# Patient Record
Sex: Male | Born: 1965 | Race: Black or African American | Hispanic: No | Marital: Single | Smoking: Current every day smoker
Health system: Southern US, Community
[De-identification: ages and names within clinical notes are randomized; demographics above are authoritative.]

## PROBLEM LIST (undated history)

## (undated) DIAGNOSIS — F32A Depression, unspecified: Secondary | ICD-10-CM

## (undated) DIAGNOSIS — K219 Gastro-esophageal reflux disease without esophagitis: Secondary | ICD-10-CM

## (undated) DIAGNOSIS — F329 Major depressive disorder, single episode, unspecified: Secondary | ICD-10-CM

## (undated) DIAGNOSIS — F319 Bipolar disorder, unspecified: Secondary | ICD-10-CM

## (undated) DIAGNOSIS — I10 Essential (primary) hypertension: Secondary | ICD-10-CM

## (undated) DIAGNOSIS — F200 Paranoid schizophrenia: Secondary | ICD-10-CM

## (undated) HISTORY — PX: FINGER SURGERY: SHX640

## (undated) HISTORY — DX: Depression, unspecified: F32.A

## (undated) HISTORY — DX: Essential (primary) hypertension: I10

## (undated) HISTORY — DX: Major depressive disorder, single episode, unspecified: F32.9

## (undated) HISTORY — DX: Gastro-esophageal reflux disease without esophagitis: K21.9

## (undated) HISTORY — PX: KNEE SURGERY: SHX244

---

## 2011-08-03 DIAGNOSIS — F209 Schizophrenia, unspecified: Secondary | ICD-10-CM | POA: Diagnosis not present

## 2011-08-31 DIAGNOSIS — F209 Schizophrenia, unspecified: Secondary | ICD-10-CM | POA: Diagnosis not present

## 2011-12-22 DIAGNOSIS — F209 Schizophrenia, unspecified: Secondary | ICD-10-CM | POA: Diagnosis not present

## 2012-01-19 DIAGNOSIS — F209 Schizophrenia, unspecified: Secondary | ICD-10-CM | POA: Diagnosis not present

## 2012-02-17 DIAGNOSIS — F209 Schizophrenia, unspecified: Secondary | ICD-10-CM | POA: Diagnosis not present

## 2012-06-12 DIAGNOSIS — F209 Schizophrenia, unspecified: Secondary | ICD-10-CM | POA: Diagnosis not present

## 2012-07-10 DIAGNOSIS — F209 Schizophrenia, unspecified: Secondary | ICD-10-CM | POA: Diagnosis not present

## 2013-03-26 DIAGNOSIS — F2089 Other schizophrenia: Secondary | ICD-10-CM | POA: Diagnosis not present

## 2013-03-27 DIAGNOSIS — F2089 Other schizophrenia: Secondary | ICD-10-CM | POA: Diagnosis not present

## 2013-04-11 DIAGNOSIS — F29 Unspecified psychosis not due to a substance or known physiological condition: Secondary | ICD-10-CM | POA: Diagnosis not present

## 2013-04-11 DIAGNOSIS — F2089 Other schizophrenia: Secondary | ICD-10-CM | POA: Diagnosis not present

## 2013-04-20 DIAGNOSIS — F29 Unspecified psychosis not due to a substance or known physiological condition: Secondary | ICD-10-CM | POA: Diagnosis not present

## 2013-04-20 DIAGNOSIS — F2089 Other schizophrenia: Secondary | ICD-10-CM | POA: Diagnosis not present

## 2013-05-09 DIAGNOSIS — F2089 Other schizophrenia: Secondary | ICD-10-CM | POA: Diagnosis not present

## 2013-05-09 DIAGNOSIS — F29 Unspecified psychosis not due to a substance or known physiological condition: Secondary | ICD-10-CM | POA: Diagnosis not present

## 2013-06-26 DIAGNOSIS — F29 Unspecified psychosis not due to a substance or known physiological condition: Secondary | ICD-10-CM | POA: Diagnosis not present

## 2013-06-26 DIAGNOSIS — F2089 Other schizophrenia: Secondary | ICD-10-CM | POA: Diagnosis not present

## 2013-07-24 DIAGNOSIS — F29 Unspecified psychosis not due to a substance or known physiological condition: Secondary | ICD-10-CM | POA: Diagnosis not present

## 2013-07-24 DIAGNOSIS — F2089 Other schizophrenia: Secondary | ICD-10-CM | POA: Diagnosis not present

## 2013-08-08 DIAGNOSIS — F29 Unspecified psychosis not due to a substance or known physiological condition: Secondary | ICD-10-CM | POA: Diagnosis not present

## 2013-08-21 DIAGNOSIS — F29 Unspecified psychosis not due to a substance or known physiological condition: Secondary | ICD-10-CM | POA: Diagnosis not present

## 2013-09-25 DIAGNOSIS — F29 Unspecified psychosis not due to a substance or known physiological condition: Secondary | ICD-10-CM | POA: Diagnosis not present

## 2013-09-26 ENCOUNTER — Other Ambulatory Visit: Payer: Self-pay | Admitting: Internal Medicine

## 2013-09-26 ENCOUNTER — Ambulatory Visit
Admission: RE | Admit: 2013-09-26 | Discharge: 2013-09-26 | Disposition: A | Discharge status: Home / Self Care | Payer: Medicare Other | Source: Ambulatory Visit | Attending: Internal Medicine | Admitting: Internal Medicine

## 2013-09-26 DIAGNOSIS — M5430 Sciatica, unspecified side: Secondary | ICD-10-CM

## 2013-09-26 DIAGNOSIS — Q5569 Other congenital malformation of penis: Secondary | ICD-10-CM | POA: Diagnosis not present

## 2013-09-26 DIAGNOSIS — I10 Essential (primary) hypertension: Secondary | ICD-10-CM | POA: Diagnosis not present

## 2013-09-26 DIAGNOSIS — M47817 Spondylosis without myelopathy or radiculopathy, lumbosacral region: Secondary | ICD-10-CM | POA: Diagnosis not present

## 2013-10-02 DIAGNOSIS — N4889 Other specified disorders of penis: Secondary | ICD-10-CM | POA: Diagnosis not present

## 2013-10-30 DIAGNOSIS — F29 Unspecified psychosis not due to a substance or known physiological condition: Secondary | ICD-10-CM | POA: Diagnosis not present

## 2013-12-04 ENCOUNTER — Emergency Department (HOSPITAL_COMMUNITY)
Admission: EM | Admit: 2013-12-04 | Discharge: 2013-12-04 | Disposition: A | Discharge status: Home / Self Care | Payer: Medicare Other | Attending: Emergency Medicine | Admitting: Emergency Medicine

## 2013-12-04 ENCOUNTER — Encounter (HOSPITAL_COMMUNITY): Payer: Self-pay | Admitting: Emergency Medicine

## 2013-12-04 DIAGNOSIS — R21 Rash and other nonspecific skin eruption: Secondary | ICD-10-CM | POA: Insufficient documentation

## 2013-12-04 DIAGNOSIS — R300 Dysuria: Secondary | ICD-10-CM | POA: Insufficient documentation

## 2013-12-04 MED ORDER — LOSARTAN POTASSIUM-HCTZ 50-12.5 MG PO TABS: 1.0000 | ORAL_TABLET | Freq: Every day | ORAL | Status: DC

## 2013-12-04 MED ORDER — CLOTRIMAZOLE 1 % EX CREA: TOPICAL_CREAM | CUTANEOUS | Status: DC

## 2013-12-04 NOTE — ED Provider Notes (Signed)
CSN: [id number]     Arrival date & time 12/04/13  0955 History  This chart was scribed for non-physician practitioner, Emilia BeckKaitlyn Rex Magee, PA-C working with Raeford RazorStephen Kohut, MD by Greggory StallionKayla Andersen, ED scribe. This patient was seen in room TR07C/TR07C and the patient's care was started at 10:10 AM.    Chief Complaint  Patient presents with  . Rash   The history is provided by the patient. No language interpreter was used.   HPI Comments: Charles Cantorhomas Liscano is a 48 y.o. male who presents to the Emergency Department complaining of worsening itchy pustules on his penis that started 4 months ago. Pt states it intermittently hurts and is sometimes scabbed over. He has used alcohol with relief of pain. States he recently had unprotected sex with an old partner. [facility] and was given a cream with no relief and urology referral. He has intermittent dysuria. Denies penile discharge, testicular pain, scrotal swelling.   History reviewed. No pertinent past medical history. History reviewed. No pertinent past surgical history. No family history on file. History  Substance Use Topics  . Smoking status: Not on file  . Smokeless tobacco: Not on file  . Alcohol Use: Not on file    Review of Systems  Genitourinary: Positive for dysuria. Negative for discharge, scrotal swelling and testicular pain.  Skin: Positive for rash.  All other systems reviewed and are negative.  Allergies  Review of patient's allergies indicates not on file.  Home Medications   Prior to Admission medications   Not on File   BP 124/74  Pulse 100  Temp(Src) 97.4 F (36.3 C) (Oral)  Resp 16  Ht 5\' 10"  (1.778 m)  Wt 200 lb (90.719 kg)  BMI 28.70 kg/m2  SpO2 100%  Physical Exam  Nursing note and vitals reviewed. Constitutional: He is oriented to person, place, and time. He appears well-developed and well-nourished. No distress.  HENT:  Head: Normocephalic and atraumatic.  Eyes: EOM are normal.   Neck: Neck supple. No tracheal deviation present.  Cardiovascular: Normal rate.   Pulmonary/Chest: Effort normal. No respiratory distress.  Genitourinary: Circumcised.  Grey-ish scabbed lesions around the glands of the penis that are non tender to palpation. No drainage. No open wounds. Scrotum unremarkable.   Musculoskeletal: Normal range of motion.  Neurological: He is alert and oriented to person, place, and time.  Skin: Skin is warm and dry.  Psychiatric: He has a normal mood and affect. His behavior is normal.    ED Course  Procedures (including critical care time)  DIAGNOSTIC STUDIES: Oxygen Saturation is 100% on RA, normal by my interpretation.    COORDINATION OF CARE: 10:14 AM-Discussed treatment plan which includes speaking with Dr. Juleen ChinaKohut with pt at bedside and pt agreed to plan.   Labs Review Labs Reviewed - No data to display  Imaging Review No results found.   EKG Interpretation None      MDM   Final diagnoses:  Penile rash    Patient will be discharged with lotrimin for possible fungal infection. He is advised to follow up with urology. Patient advised to wash with mild soap and dry the area. Vitals stable and patient afebrile.   I personally performed the services described in this documentation, which was scribed in my presence. The recorded information has been reviewed and is accurate.  Emilia BeckKaitlyn Tellis Spivak, [place]PA-C 12/04/13 1207

## 2013-12-04 NOTE — ED Notes (Signed)
PT reports the rash and itching to penis started 3 months ago. Pt was seen and treated by a PCP . There has been no change in rash. Pt reports SX's worse.

## 2013-12-04 NOTE — Discharge Instructions (Signed)
Apply the prescribed cream to the affected area. Discontinue use of alcohol to the area. Use mild soap to wash the area and dry well.

## 2013-12-04 NOTE — Discharge Planning (Signed)
[facility]  Patient expressed to PA IyanbitoKaitlyn S. that he was in need of a primary care provider. Spoke to pt stated he was discharged from his PCP for unknown reasons. Patient was given a list of providers in the area who accept his insurance. My contact information was provided for any future questions or concerns.

## 2013-12-08 NOTE — ED Provider Notes (Signed)
Medical screening examination/treatment/procedure(s) were performed by non-physician practitioner and as supervising physician I was immediately available for consultation/collaboration.   EKG Interpretation None       Anandi Abramo, MD 12/08/13 1429 

## 2014-03-28 ENCOUNTER — Encounter (HOSPITAL_COMMUNITY): Payer: Self-pay | Admitting: Emergency Medicine

## 2014-03-28 ENCOUNTER — Emergency Department (HOSPITAL_COMMUNITY)
Admission: EM | Admit: 2014-03-28 | Discharge: 2014-03-28 | Discharge status: Against Medical Advice | Payer: Medicare Other | Attending: Emergency Medicine | Admitting: Emergency Medicine

## 2014-03-28 DIAGNOSIS — F430 Acute stress reaction: Secondary | ICD-10-CM | POA: Insufficient documentation

## 2014-03-28 DIAGNOSIS — F17200 Nicotine dependence, unspecified, uncomplicated: Secondary | ICD-10-CM | POA: Insufficient documentation

## 2014-03-28 DIAGNOSIS — I10 Essential (primary) hypertension: Secondary | ICD-10-CM | POA: Diagnosis not present

## 2014-03-28 DIAGNOSIS — R52 Pain, unspecified: Secondary | ICD-10-CM | POA: Diagnosis not present

## 2014-03-28 DIAGNOSIS — Z5982 Transportation insecurity: Secondary | ICD-10-CM

## 2014-03-28 DIAGNOSIS — M25579 Pain in unspecified ankle and joints of unspecified foot: Secondary | ICD-10-CM | POA: Diagnosis not present

## 2014-03-28 DIAGNOSIS — Z79899 Other long term (current) drug therapy: Secondary | ICD-10-CM | POA: Insufficient documentation

## 2014-03-28 DIAGNOSIS — Z598 Other problems related to housing and economic circumstances: Secondary | ICD-10-CM

## 2014-03-28 NOTE — ED Notes (Signed)
Pt walking around ED sts he is banned from bus and wanted a police officer to take him home.  He then asked for numbers to cabs and then came back stating he wanted to check in

## 2014-03-28 NOTE — ED Notes (Signed)
Pt. Is in the room screaming and talking to himself.  Pt. Also was laying in the bed, moving all over the bed.  Dr. Loretha Stapler went to talk with pt. Pt. Is yelling at Dr. Loretha Stapler.

## 2014-03-28 NOTE — ED Notes (Signed)
Pt in c/ o generalized body aches, onset x 1-2 mths, pt c/o chronic L lower back pain , pt non cooperative, pt c/o bil feet pain, pt has large amt callouses on heels, pt A&O x4

## 2014-03-28 NOTE — ED Notes (Signed)
Pt.came out and stated , "I have to get home.  I live on [address] and I am not  Able to ride the bus."  "I will be arrested."  "I want to talk to the police."  Pt. Taken to speak to the police.  Informed Dr. Loretha Stapler  That pt. Wants to leave.

## 2014-03-28 NOTE — ED Provider Notes (Signed)
CSN: [id number]     Arrival date & time 03/28/14  0820 History   First MD Initiated Contact with Patient 03/28/14 [phone]     Chief Complaint  Patient presents with  . Generalized Body Aches     (Consider location/radiation/quality/duration/timing/severity/associated sxs/prior Treatment) HPI Comments: 48 yo male presenting to the Emergency Department with the chief complaint of "I need a ride home."  He states he was [place] from the bus depot and therefore came here looking for a ride to get home.  During his short ED stay, he made several complaints to the nursing staff, such as body aches and bilateral foot pain.  He was not willing to discuss these complaints with me.  He would only state that he needed a ride home.    He reported that he takes medicine for high blood pressure but denied any other medications or medical problems.  History was otherwise limited to the above.    History reviewed. No pertinent past medical history. Past Surgical History  Procedure Laterality Date  . Knee surgery    . Finger surgery     No family history on file. History  Substance Use Topics  . Smoking status: Current Every Day Smoker -- 0.50 packs/day  . Smokeless tobacco: Not on file  . Alcohol Use: No    Review of Systems  Unable to perform ROS Pt uncooperative with ROS    Allergies  Pork-derived products  Home Medications   Prior to Admission medications   Medication Sig Start Date End Date Taking? Authorizing Provider  losartan-hydrochlorothiazide (HYZAAR) 50-12.5 MG per tablet Take 1 tablet by mouth daily. 12/04/13  Yes Kaitlyn Szekalski, PA-C   BP 145/74  Pulse 114  Temp(Src) 97.8 F (36.6 C) (Oral)  Resp 16  SpO2 100% Physical Exam  Nursing note and vitals reviewed. Constitutional: He is oriented to person, place, and time. He appears well-developed and well-nourished. He is uncooperative. No distress.  disheveled  HENT:  Head: Normocephalic and atraumatic.  Eyes:  Conjunctivae are normal. No scleral icterus.  Neck: Neck supple.  Cardiovascular: Normal rate and intact distal pulses.   Pulmonary/Chest: Effort normal. No stridor. No respiratory distress.  Abdominal: Normal appearance. He exhibits no distension.  Neurological: He is alert and oriented to person, place, and time.  Skin: Skin is warm and dry. No rash noted.  Psychiatric: His speech is normal. He is agitated.    ED Course  Procedures (including critical care time) Labs Review Labs Reviewed - No data to display  Imaging Review No results found.   EKG Interpretation None      MDM   Final diagnoses:  Inability to acquire transportation    48 yo male presenting with the chief complaint of needed a ride home.  He was agitated, but alert and oriented.  He denied specific complaints to me.  Before I was able to obtain any additional history, he decided that he did not want to stay and that the only reason that he came to the ED was to have [place] call the police so he could get a ride home from them.      Candyce Churn III, MD 03/28/14 1021

## 2014-03-29 ENCOUNTER — Emergency Department (HOSPITAL_COMMUNITY)
Admission: EM | Admit: 2014-03-29 | Discharge: 2014-03-31 | Disposition: A | Discharge status: Home / Self Care | Payer: Medicare Other | Attending: Emergency Medicine | Admitting: Emergency Medicine

## 2014-03-29 ENCOUNTER — Encounter (HOSPITAL_COMMUNITY): Payer: Self-pay | Admitting: Emergency Medicine

## 2014-03-29 DIAGNOSIS — F22 Delusional disorders: Secondary | ICD-10-CM | POA: Diagnosis not present

## 2014-03-29 DIAGNOSIS — F209 Schizophrenia, unspecified: Secondary | ICD-10-CM | POA: Diagnosis present

## 2014-03-29 DIAGNOSIS — F911 Conduct disorder, childhood-onset type: Secondary | ICD-10-CM | POA: Insufficient documentation

## 2014-03-29 DIAGNOSIS — F29 Unspecified psychosis not due to a substance or known physiological condition: Secondary | ICD-10-CM | POA: Insufficient documentation

## 2014-03-29 DIAGNOSIS — Z91199 Patient's noncompliance with other medical treatment and regimen due to unspecified reason: Secondary | ICD-10-CM | POA: Insufficient documentation

## 2014-03-29 DIAGNOSIS — Z9119 Patient's noncompliance with other medical treatment and regimen: Secondary | ICD-10-CM | POA: Diagnosis not present

## 2014-03-29 DIAGNOSIS — Z008 Encounter for other general examination: Secondary | ICD-10-CM | POA: Diagnosis not present

## 2014-03-29 DIAGNOSIS — Z79899 Other long term (current) drug therapy: Secondary | ICD-10-CM | POA: Diagnosis not present

## 2014-03-29 DIAGNOSIS — R45850 Homicidal ideations: Secondary | ICD-10-CM | POA: Insufficient documentation

## 2014-03-29 DIAGNOSIS — F411 Generalized anxiety disorder: Secondary | ICD-10-CM | POA: Insufficient documentation

## 2014-03-29 DIAGNOSIS — F909 Attention-deficit hyperactivity disorder, unspecified type: Secondary | ICD-10-CM | POA: Insufficient documentation

## 2014-03-29 DIAGNOSIS — F200 Paranoid schizophrenia: Secondary | ICD-10-CM | POA: Diagnosis not present

## 2014-03-29 DIAGNOSIS — F23 Brief psychotic disorder: Secondary | ICD-10-CM

## 2014-03-29 DIAGNOSIS — IMO0002 Reserved for concepts with insufficient information to code with codable children: Secondary | ICD-10-CM | POA: Diagnosis not present

## 2014-03-29 HISTORY — DX: Paranoid schizophrenia: F20.0

## 2014-03-29 HISTORY — DX: Bipolar disorder, unspecified: F31.9

## 2014-03-29 LAB — CBC WITH DIFFERENTIAL/PLATELET
Basophils Absolute: 0 10*3/uL (ref 0.0–0.1)
Basophils Relative: 0 % (ref 0–1)
EOS ABS: 0.1 10*3/uL (ref 0.0–0.7)
EOS PCT: 1 % (ref 0–5)
HCT: 36.5 % — ABNORMAL LOW (ref 39.0–52.0)
Hemoglobin: 12.7 g/dL — ABNORMAL LOW (ref 13.0–17.0)
Lymphocytes Relative: 23 % (ref 12–46)
Lymphs Abs: 1.9 10*3/uL (ref 0.7–4.0)
MCH: 28 pg (ref 26.0–34.0)
MCHC: 34.8 g/dL (ref 30.0–36.0)
MCV: 80.4 fL (ref 78.0–100.0)
MONOS PCT: 10 % (ref 3–12)
Monocytes Absolute: 0.8 10*3/uL (ref 0.1–1.0)
NEUTROS PCT: 66 % (ref 43–77)
Neutro Abs: 5.4 10*3/uL (ref 1.7–7.7)
PLATELETS: 257 10*3/uL (ref 150–400)
RBC: 4.54 MIL/uL (ref 4.22–5.81)
RDW: 12.7 % (ref 11.5–15.5)
WBC: 8.2 10*3/uL (ref 4.0–10.5)

## 2014-03-29 LAB — COMPREHENSIVE METABOLIC PANEL
ALBUMIN: 3.9 g/dL (ref 3.5–5.2)
ALK PHOS: 77 U/L (ref 39–117)
ALT: 39 U/L (ref 0–53)
ANION GAP: 12 (ref 5–15)
AST: 39 U/L — ABNORMAL HIGH (ref 0–37)
BUN: 11 mg/dL (ref 6–23)
CALCIUM: 9.9 mg/dL (ref 8.4–10.5)
CO2: 25 mEq/L (ref 19–32)
Chloride: 95 mEq/L — ABNORMAL LOW (ref 96–112)
Creatinine, Ser: 0.91 mg/dL (ref 0.50–1.35)
GFR calc non Af Amer: >90 mL/min (ref >90)
GLUCOSE: 131 mg/dL — AB (ref 70–99)
Potassium: 3.9 mEq/L (ref 3.7–5.3)
Sodium: 132 mEq/L — ABNORMAL LOW (ref 137–147)
TOTAL PROTEIN: 7.2 g/dL (ref 6.0–8.3)
Total Bilirubin: 0.4 mg/dL (ref 0.3–1.2)

## 2014-03-29 LAB — RAPID URINE DRUG SCREEN, HOSP PERFORMED
Amphetamines: NOT DETECTED
BARBITURATES: NOT DETECTED
Benzodiazepines: NOT DETECTED
Cocaine: NOT DETECTED
OPIATES: NOT DETECTED
Tetrahydrocannabinol: NOT DETECTED

## 2014-03-29 LAB — SALICYLATE LEVEL: Salicylate Lvl: <2 mg/dL — ABNORMAL LOW (ref 2.8–20.0)

## 2014-03-29 LAB — ACETAMINOPHEN LEVEL: Acetaminophen (Tylenol), Serum: <15 ug/mL (ref 10–30)

## 2014-03-29 LAB — ETHANOL

## 2014-03-29 MED ORDER — RISPERIDONE 2 MG PO TABS
2.0000 mg | ORAL_TABLET | Freq: Two times a day (BID) | ORAL | Status: DC
  Administered 2014-03-29 – 2014-03-31 (×5): 2 mg via ORAL
  Filled 2014-03-29 (×6): qty 1

## 2014-03-29 MED ORDER — DIPHENHYDRAMINE HCL 50 MG/ML IJ SOLN: 50.0000 mg | Freq: Once | INTRAMUSCULAR | Status: AC

## 2014-03-29 MED ORDER — LORAZEPAM 2 MG/ML IJ SOLN
2.0000 mg | Freq: Once | INTRAMUSCULAR | Status: AC
  Administered 2014-03-29: 2 mg via INTRAMUSCULAR

## 2014-03-29 MED ORDER — ZIPRASIDONE MESYLATE 20 MG IM SOLR
20.0000 mg | Freq: Once | INTRAMUSCULAR | Status: AC
  Administered 2014-03-29: 20 mg via INTRAMUSCULAR

## 2014-03-29 MED ORDER — BENZTROPINE MESYLATE 1 MG PO TABS
1.0000 mg | ORAL_TABLET | Freq: Two times a day (BID) | ORAL | Status: DC
  Administered 2014-03-29 – 2014-03-31 (×5): 1 mg via ORAL
  Filled 2014-03-29 (×6): qty 1

## 2014-03-29 MED ORDER — ZIPRASIDONE MESYLATE 20 MG IM SOLR: 20.0000 mg | Freq: Once | INTRAMUSCULAR | Status: AC

## 2014-03-29 MED ORDER — ZIPRASIDONE MESYLATE 20 MG IM SOLR
INTRAMUSCULAR | Status: AC
  Filled 2014-03-29: qty 20

## 2014-03-29 MED ORDER — LORAZEPAM 2 MG/ML IJ SOLN
INTRAMUSCULAR | Status: AC
  Administered 2014-03-29: 2 mg via INTRAMUSCULAR
  Filled 2014-03-29: qty 1

## 2014-03-29 MED ORDER — TRAZODONE HCL 50 MG PO TABS: 50.0000 mg | ORAL_TABLET | Freq: Every evening | ORAL | Status: DC | PRN

## 2014-03-29 MED ORDER — STERILE WATER FOR INJECTION IJ SOLN
INTRAMUSCULAR | Status: AC
  Filled 2014-03-29: qty 10

## 2014-03-29 MED ORDER — DIPHENHYDRAMINE HCL 50 MG/ML IJ SOLN
INTRAMUSCULAR | Status: AC
  Filled 2014-03-29: qty 1

## 2014-03-29 MED ORDER — LORAZEPAM 2 MG/ML IJ SOLN: 2.0000 mg | Freq: Three times a day (TID) | INTRAMUSCULAR | Status: DC | PRN

## 2014-03-29 MED ORDER — ZIPRASIDONE MESYLATE 20 MG IM SOLR: 10.0000 mg | Freq: Three times a day (TID) | INTRAMUSCULAR | Status: DC | PRN

## 2014-03-29 MED ORDER — DIPHENHYDRAMINE HCL 50 MG/ML IJ SOLN: 50.0000 mg | Freq: Three times a day (TID) | INTRAMUSCULAR | Status: DC | PRN

## 2014-03-29 MED ADMIN — Diphenhydramine HCl Inj 50 MG/ML: INTRAMUSCULAR | @ 09:00:00

## 2014-03-29 NOTE — ED Notes (Signed)
Per patient's sister she notice a change in his behavior when she came to visit in June. The patient was very easily agitated and aggressive towards family for no reason. Sister has been coming back and forth from [place] to check on dying mother and the patient's behavior and appearance. According to sister patient has history of bipolar disorder, paranoid schizophrenia and crack cocaine addiction. He was brought in tonight by sister and father for help.

## 2014-03-29 NOTE — ED Notes (Signed)
Family has patient's belongings. (Sister)

## 2014-03-29 NOTE — Consult Note (Signed)
[facility] Face-to-Face Psychiatry Consult   Reason for Consult:  Paranoia and aggressive behavior Referring Physician:  EDP Charles Cortez is an 48 y.o. male. Total Time spent with patient: 30 minutes  Assessment: AXIS I:  Chronic Paranoid Schizophrenia AXIS II:  Cluster B Traits AXIS III:   Past Medical History  Diagnosis Date  . Paranoid schizophrenia   . Bipolar affective disorder    AXIS IV:  other psychosocial or environmental problems and problems with access to health care services AXIS V:  21-30 behavior considerably influenced by delusions or hallucinations OR serious impairment in judgment, communication OR inability to function in almost all areas  Plan:  Recommend psychiatric Inpatient admission when medically cleared.  Subjective:   Charles Cortez is a 48 y.o. male patient admitted with aggressive behavior and paranoia.  HPI: Patient with history of paranoid schizophrenia and bipolar affective disorder presents to the emergency department for behavioral evaluation.Patient is uncooperative and belligerent most of the history is obtained from the chart and the nurses. Per record, patient's sister states that she noticed a change in his behavior when she came to visit in June. She states that patient has been very easily agitated and aggressive, threatening others for no reason. She states that this behavior got him kicked off the public transit system. She  endorses a history of crack cocaine addiction, but states the patient has been clean for 7 years. She believes he has been noncompliant with his psychiatric regimen. Patient visibly paranoid and easily agitated on arrival. He is fixated on the police being against him, he states that they have no business barring him from using the public transportation. Patient got really agitated during the interview and had to receive  $Remove'20mg'ysUuVEb$  IM Geodon, $RemoveBe'50mg'LDHcIvhol$  of Benadryl and Ativan when verbal de-escalation failed.  HPI Elements:   Location:  mood  lability, psychosis, delusions. Severity:  reccurent episode. Duration:  2 months. Context:  poor compliance with medications.  Past Psychiatric History: Past Medical History  Diagnosis Date  . Paranoid schizophrenia   . Bipolar affective disorder     reports that he has been smoking.  He does not have any smokeless tobacco history on file. He reports that he does not drink alcohol or use illicit drugs. No family history on file.         Allergies:   Allergies  Allergen Reactions  . Pork-Derived Products Swelling    ACT Assessment Complete:  Yes:    Educational Status    Risk to Self: Risk to self with the past 6 months Is patient at risk for suicide?: Yes Substance abuse history and/or treatment for substance abuse?: Yes  Risk to Others:    Abuse:    Prior Inpatient Therapy:    Prior Outpatient Therapy:    Additional Information:           Objective: Blood pressure 112/97, pulse 82, temperature 98.1 F (36.7 C), temperature source Oral, resp. rate 16, SpO2 99.00%.There is no weight on file to calculate BMI. Results for orders placed during the hospital encounter of 03/29/14 (from the past 72 hour(s))  CBC WITH DIFFERENTIAL     Status: Abnormal   Collection Time    03/29/14  1:06 AM      Result Value Ref Range   WBC 8.2  4.0 - 10.5 K/uL   RBC 4.54  4.22 - 5.81 MIL/uL   Hemoglobin 12.7 (*) 13.0 - 17.0 g/dL   HCT 36.5 (*) 39.0 - 52.0 %   MCV  80.4  78.0 - 100.0 fL   MCH 28.0  26.0 - 34.0 pg   MCHC 34.8  30.0 - 36.0 g/dL   RDW 12.7  11.5 - 15.5 %   Platelets 257  150 - 400 K/uL   Neutrophils Relative % 66  43 - 77 %   Neutro Abs 5.4  1.7 - 7.7 K/uL   Lymphocytes Relative 23  12 - 46 %   Lymphs Abs 1.9  0.7 - 4.0 K/uL   Monocytes Relative 10  3 - 12 %   Monocytes Absolute 0.8  0.1 - 1.0 K/uL   Eosinophils Relative 1  0 - 5 %   Eosinophils Absolute 0.1  0.0 - 0.7 K/uL   Basophils Relative 0  0 - 1 %   Basophils Absolute 0.0  0.0 - 0.1 K/uL  COMPREHENSIVE  METABOLIC PANEL     Status: Abnormal   Collection Time    03/29/14  1:06 AM      Result Value Ref Range   Sodium 132 (*) 137 - 147 mEq/L   Potassium 3.9  3.7 - 5.3 mEq/L   Chloride 95 (*) 96 - 112 mEq/L   CO2 25  19 - 32 mEq/L   Glucose, Bld 131 (*) 70 - 99 mg/dL   BUN 11  6 - 23 mg/dL   Creatinine, Ser 0.91  0.50 - 1.35 mg/dL   Calcium 9.9  8.4 - 10.5 mg/dL   Total Protein 7.2  6.0 - 8.3 g/dL   Albumin 3.9  3.5 - 5.2 g/dL   AST 39 (*) 0 - 37 U/L   ALT 39  0 - 53 U/L   Alkaline Phosphatase 77  39 - 117 U/L   Total Bilirubin 0.4  0.3 - 1.2 mg/dL   GFR calc non Af Amer >90  >90 mL/min   GFR calc Af Amer >90  >90 mL/min   Comment: (NOTE)     The eGFR has been calculated using the CKD EPI equation.     This calculation has not been validated in all clinical situations.     eGFR's persistently <90 mL/min signify possible Chronic Kidney     Disease.   Anion gap 12  5 - 15  ETHANOL     Status: None   Collection Time    03/29/14  1:06 AM      Result Value Ref Range   Alcohol, Ethyl (B) <11  0 - 11 mg/dL   Comment:            LOWEST DETECTABLE LIMIT FOR     SERUM ALCOHOL IS 11 mg/dL     FOR MEDICAL PURPOSES ONLY  ACETAMINOPHEN LEVEL     Status: None   Collection Time    03/29/14  1:06 AM      Result Value Ref Range   Acetaminophen (Tylenol), Serum <15.0  10 - 30 ug/mL   Comment:            THERAPEUTIC CONCENTRATIONS VARY     SIGNIFICANTLY. A RANGE OF 10-30     ug/mL MAY BE AN EFFECTIVE     CONCENTRATION FOR MANY PATIENTS.     HOWEVER, SOME ARE BEST TREATED     AT CONCENTRATIONS OUTSIDE THIS     RANGE.     ACETAMINOPHEN CONCENTRATIONS     >150 ug/mL AT 4 HOURS AFTER     INGESTION AND >50 ug/mL AT 12     HOURS AFTER INGESTION ARE  OFTEN ASSOCIATED WITH TOXIC     REACTIONS.  SALICYLATE LEVEL     Status: Abnormal   Collection Time    03/29/14  1:06 AM      Result Value Ref Range   Salicylate Lvl <7.0 (*) 2.8 - 20.0 mg/dL  URINE RAPID DRUG SCREEN (HOSP PERFORMED)      Status: None   Collection Time    03/29/14  1:16 AM      Result Value Ref Range   Opiates NONE DETECTED  NONE DETECTED   Cocaine NONE DETECTED  NONE DETECTED   Benzodiazepines NONE DETECTED  NONE DETECTED   Amphetamines NONE DETECTED  NONE DETECTED   Tetrahydrocannabinol NONE DETECTED  NONE DETECTED   Barbiturates NONE DETECTED  NONE DETECTED   Comment:            DRUG SCREEN FOR MEDICAL PURPOSES     ONLY.  IF CONFIRMATION IS NEEDED     FOR ANY PURPOSE, NOTIFY LAB     WITHIN 5 DAYS.                LOWEST DETECTABLE LIMITS     FOR URINE DRUG SCREEN     Drug Class       Cutoff (ng/mL)     Amphetamine      1000     Barbiturate      200     Benzodiazepine   263     Tricyclics       785     Opiates          300     Cocaine          300     THC              50   Labs are reviewed and are pertinent for the above.  Current Facility-Administered Medications  Medication Dose Route Frequency Provider Last Rate Last Dose  . benztropine (COGENTIN) tablet 1 mg  1 mg Oral BID Hayleigh Bawa      . diphenhydrAMINE (BENADRYL) injection 50 mg  50 mg Intramuscular Q8H PRN Mylez Venable      . LORazepam (ATIVAN) injection 2 mg  2 mg Intramuscular Q8H PRN Jionni Helming      . risperiDONE (RISPERDAL) tablet 2 mg  2 mg Oral BID Kasten Leveque      . traZODone (DESYREL) tablet 50 mg  50 mg Oral QHS PRN Ivanell Deshotel      . ziprasidone (GEODON) injection 10 mg  10 mg Intramuscular Q8H PRN Kesha Hurrell       Current Outpatient Prescriptions  Medication Sig Dispense Refill  . losartan-hydrochlorothiazide (HYZAAR) 50-12.5 MG per tablet Take 1 tablet by mouth daily.  30 tablet  0    Psychiatric Specialty Exam:     Blood pressure 112/97, pulse 82, temperature 98.1 F (36.7 C), temperature source Oral, resp. rate 16, SpO2 99.00%.There is no weight on file to calculate BMI.  General Appearance: Disheveled  Eye Contact::  Minimal  Speech:  Pressured  Volume:  Normal  Mood:  Angry and  Irritable  Affect:  Labile  Thought Process:  Circumstantial and Disorganized  Orientation:  Full (Time, Place, and Person)  Thought Content:  Hallucinations: Auditory and Paranoid Ideation  Suicidal Thoughts:  Yes.  without intent/plan  Homicidal Thoughts:  Yes.  without intent/plan  Memory:  Immediate;   Fair Recent;   Fair Remote;   Fair  Judgement:  Impaired  Insight:  Lacking  Psychomotor Activity:  Increased  Concentration:  Poor  Recall:  Deepstep: Fair  Akathisia:  No  Handed:  Right  AIMS (if indicated):     Assets:  Communication Skills  Sleep:   poor   Musculoskeletal: Strength & Muscle Tone: within normal limits Gait & Station: normal Patient leans: N/A  Treatment Plan Summary: Daily contact with patient to assess and evaluate symptoms and progress in treatment Medication management Patient needs inpatient placemant for stabilization  Corena Pilgrim, MD 03/29/2014 10:55 AM

## 2014-03-29 NOTE — ED Notes (Signed)
Sister [name] [phone] [facility] [phone]- [phone] Jerene Bears [phone]

## 2014-03-29 NOTE — ED Notes (Signed)
Report received from Tanika RN. Pt. Alert and oriented in no distress denies SI, HI, AVH and pain. Will continue to monitor for safety. Pt. Instructed to come to me with problems or concerns. Q 15 minute checks continue. 

## 2014-03-29 NOTE — ED Notes (Signed)
Family at bedside. 

## 2014-03-29 NOTE — ED Provider Notes (Signed)
Medical screening examination/treatment/procedure(s) were performed by non-physician practitioner and as supervising physician I was immediately available for consultation/collaboration.   EKG Interpretation None       Robbyn Hodkinson M Jhonatan Lomeli, MD 03/29/14 0758 

## 2014-03-29 NOTE — ED Provider Notes (Signed)
CSN: [id number]     Arrival date & time 03/29/14  0027 History   First MD Initiated Contact with Patient 03/29/14 0035     No chief complaint on file.    (Consider location/radiation/quality/duration/timing/severity/associated sxs/prior Treatment) HPI Comments: Level 5 caveat applies secondary to acute psychosis.  48 year old male with a history of paranoid schizophrenia and bipolar affective disorder presents to the emergency department for behavioral evaluation. Patient's sister states that she noticed a change in his behavior when she came to visit in June. She states that patient has been very easily agitated and aggressive, threatening others for no reason. She states that this behavior got him kicked off the public transit system. Sr. endorses a history of crack cocaine addiction, but states the patient has been clean for 7 years. She believes he has been noncompliant with his psychiatric regimen. Patient visibly paranoid and easily agitated on arrival.  IM Geodon administered. IVC initiated.  The history is provided by a relative and a parent. No language interpreter was used.    Past Medical History  Diagnosis Date  . Paranoid schizophrenia   . Bipolar affective disorder    Past Surgical History  Procedure Laterality Date  . Knee surgery    . Finger surgery     No family history on file. History  Substance Use Topics  . Smoking status: Current Every Day Smoker -- 0.50 packs/day  . Smokeless tobacco: Not on file  . Alcohol Use: No    Review of Systems  Unable to perform ROS: Psychiatric disorder  Psychiatric/Behavioral: Positive for behavioral problems.    Allergies  Pork-derived products  Home Medications   Prior to Admission medications   Medication Sig Start Date End Date Taking? Authorizing Provider  losartan-hydrochlorothiazide (HYZAAR) 50-12.5 MG per tablet Take 1 tablet by mouth daily. 12/04/13   Kaitlyn Szekalski, PA-C   BP 150/83  Pulse 96   Temp(Src) 98.1 F (36.7 C) (Oral)  Resp 18  SpO2 99%  Physical Exam  Nursing note and vitals reviewed. Constitutional: He is oriented to person, place, and time. He appears well-developed and well-nourished. No distress.  HENT:  Head: Normocephalic and atraumatic.  Eyes: Conjunctivae and EOM are normal. No scleral icterus.  Neck: Normal range of motion.  Pulmonary/Chest: Effort normal. No respiratory distress.  Musculoskeletal: Normal range of motion.  Neurological: He is alert and oriented to person, place, and time.  Skin: Skin is warm and dry. No rash noted. He is not diaphoretic. No erythema. No pallor.  Psychiatric: His mood appears anxious. His speech is rapid and/or pressured. He is agitated, aggressive, hyperactive and combative. Thought content is paranoid. He expresses impulsivity and inappropriate judgment.    ED Course  Procedures (including critical care time) Labs Review Labs Reviewed  CBC WITH DIFFERENTIAL - Abnormal; Notable for the following:    Hemoglobin 12.7 (*)    HCT 36.5 (*)    All other components within normal limits  COMPREHENSIVE METABOLIC PANEL - Abnormal; Notable for the following:    Sodium 132 (*)    Chloride 95 (*)    Glucose, Bld 131 (*)    AST 39 (*)    All other components within normal limits  SALICYLATE LEVEL - Abnormal; Notable for the following:    Salicylate Lvl <2.0 (*)    All other components within normal limits  ETHANOL  URINE RAPID DRUG SCREEN (HOSP PERFORMED)  ACETAMINOPHEN LEVEL    Imaging Review No results found.   EKG Interpretation None  MDM   Final diagnoses:  Acute psychosis  Paranoia (psychosis)    48 year old male with a history of paranoid schizophrenia and bipolar affective disorder presents for behavioral evaluation. Sister states that patient has been easily agitated and aggressive towards others, making homicidal threats. She believes the patient has been noncompliant with his psychiatric regimen.  Sister endorses a history of crack cocaine addiction in patient, but denies recent use.   IVC initiated on arrival. Patient required 20 mg IM Geodon secondary to agitation. He has been calm since this time. Labs reviewed and patient medically cleared. Patient currently pending TTS evaluation. Anticipate inpatient psychiatric treatment.   Filed Vitals:   03/29/14 0048  BP: 150/83  Pulse: 96  Temp: 98.1 F (36.7 C)  TempSrc: Oral  Resp: 18  SpO2: 99%       Antony Madura, PA-C 03/29/14 [phone]

## 2014-03-30 ENCOUNTER — Encounter (HOSPITAL_COMMUNITY): Payer: Self-pay | Admitting: Psychiatry

## 2014-03-30 DIAGNOSIS — F209 Schizophrenia, unspecified: Secondary | ICD-10-CM | POA: Diagnosis not present

## 2014-03-30 DIAGNOSIS — F200 Paranoid schizophrenia: Secondary | ICD-10-CM | POA: Diagnosis not present

## 2014-03-30 NOTE — ED Notes (Signed)
Snack given.

## 2014-03-30 NOTE — ED Notes (Signed)
In day room watching tv 

## 2014-03-30 NOTE — ED Notes (Addendum)
Pt's sister Elonda Husky) reports that the patient has been in this area(moved from [place]) for about a year and has been out of control "for a while."  She reports that he was on an injections, but she does not know what they were.  She is going to try and find out what the medications are and bring them in tomorrow.

## 2014-03-30 NOTE — ED Notes (Signed)
Up to the bathroom 

## 2014-03-30 NOTE — BH Assessment (Signed)
[facility] Assessment Progress Note    Received call from [facility] @ [phone] stating pt declined at [facility] due to being too aggressive for their unit.  Casimer Lanius, MS, [facility] Licensed Professional Counselor Triage Specialist

## 2014-03-30 NOTE — ED Notes (Signed)
Pt awake, afraid that he is going to be hurt. Up to the bathroom, pt reasurred that he is safe here.

## 2014-03-30 NOTE — ED Notes (Signed)
Visiting w/ sister in day room

## 2014-03-30 NOTE — ED Notes (Signed)
Reading magazines at the desk, nad

## 2014-03-30 NOTE — ED Notes (Signed)
Dr Alecia Lemming  NP into see

## 2014-03-30 NOTE — ED Notes (Signed)
Report received from Jannie RN. Pt. Alert and oriented in no distress denies SI, HI, AVH and pain. Will continue to monitor for safety. Pt. Instructed to come to me with problems or concerns. Q 15 minute checks continue. 

## 2014-03-30 NOTE — Consult Note (Signed)
[facility] Face-to-Face Psychiatry Consult   Reason for Consult:  Schizophrenia  Referring Physician:  EDP  Charles Cortez is an 48 y.o. male. Total Time spent with patient: 20 minutes  Assessment: AXIS I:  schizophrenia AXIS II:  Deferred AXIS III:   Past Medical History  Diagnosis Date  . Paranoid schizophrenia   . Bipolar affective disorder    AXIS IV:  other psychosocial or environmental problems, problems related to social environment and problems with primary support group AXIS V:  21-30 behavior considerably influenced by delusions or hallucinations OR serious impairment in judgment, communication OR inability to function in almost all areas  Plan:  Recommend psychiatric Inpatient admission when medically cleared.Dr. Louretta Shorten assessed the patient and concurs with the plan.  Subjective:   Charles Cortez is a 48 y.o. male patient admitted with exacerbation of schizophrenia.  HPI:  The patient was calm at the beginning of the assessment but quickly escalated to agitation.  He has no insight to his issues, disorganized thought processes.  Charles Cortez wants to discharge to return home and get back to work.  He is far too unstable at this point for discharge.   Past Psychiatric History: Past Medical History  Diagnosis Date  . Paranoid schizophrenia   . Bipolar affective disorder     reports that he has been smoking.  He does not have any smokeless tobacco history on file. He reports that he does not drink alcohol or use illicit drugs. History reviewed. No pertinent family history.         Allergies:   Allergies  Allergen Reactions  . Pork-Derived Products Swelling    ACT Assessment Complete:  Yes:    Educational Status    Risk to Self: Risk to self with the past 6 months Is patient at risk for suicide?: Yes Substance abuse history and/or treatment for substance abuse?: Yes  Risk to Others:    Abuse:    Prior Inpatient Therapy:    Prior Outpatient Therapy:    Additional  Information:                    Objective: Blood pressure 125/77, pulse 74, temperature 98.2 F (36.8 C), temperature source Oral, resp. rate 16, SpO2 97.00%.There is no weight on file to calculate BMI. Results for orders placed during the hospital encounter of 03/29/14 (from the past 72 hour(s))  CBC WITH DIFFERENTIAL     Status: Abnormal   Collection Time    03/29/14  1:06 AM      Result Value Ref Range   WBC 8.2  4.0 - 10.5 K/uL   RBC 4.54  4.22 - 5.81 MIL/uL   Hemoglobin 12.7 (*) 13.0 - 17.0 g/dL   HCT 36.5 (*) 39.0 - 52.0 %   MCV 80.4  78.0 - 100.0 fL   MCH 28.0  26.0 - 34.0 pg   MCHC 34.8  30.0 - 36.0 g/dL   RDW 12.7  11.5 - 15.5 %   Platelets 257  150 - 400 K/uL   Neutrophils Relative % 66  43 - 77 %   Neutro Abs 5.4  1.7 - 7.7 K/uL   Lymphocytes Relative 23  12 - 46 %   Lymphs Abs 1.9  0.7 - 4.0 K/uL   Monocytes Relative 10  3 - 12 %   Monocytes Absolute 0.8  0.1 - 1.0 K/uL   Eosinophils Relative 1  0 - 5 %   Eosinophils Absolute 0.1  0.0 - 0.7 K/uL  Basophils Relative 0  0 - 1 %   Basophils Absolute 0.0  0.0 - 0.1 K/uL  COMPREHENSIVE METABOLIC PANEL     Status: Abnormal   Collection Time    03/29/14  1:06 AM      Result Value Ref Range   Sodium 132 (*) 137 - 147 mEq/L   Potassium 3.9  3.7 - 5.3 mEq/L   Chloride 95 (*) 96 - 112 mEq/L   CO2 25  19 - 32 mEq/L   Glucose, Bld 131 (*) 70 - 99 mg/dL   BUN 11  6 - 23 mg/dL   Creatinine, Ser 0.91  0.50 - 1.35 mg/dL   Calcium 9.9  8.4 - 10.5 mg/dL   Total Protein 7.2  6.0 - 8.3 g/dL   Albumin 3.9  3.5 - 5.2 g/dL   AST 39 (*) 0 - 37 U/L   ALT 39  0 - 53 U/L   Alkaline Phosphatase 77  39 - 117 U/L   Total Bilirubin 0.4  0.3 - 1.2 mg/dL   GFR calc non Af Amer >90  >90 mL/min   GFR calc Af Amer >90  >90 mL/min   Comment: (NOTE)     The eGFR has been calculated using the CKD EPI equation.     This calculation has not been validated in all clinical situations.     eGFR's persistently <90 mL/min signify  possible Chronic Kidney     Disease.   Anion gap 12  5 - 15  ETHANOL     Status: None   Collection Time    03/29/14  1:06 AM      Result Value Ref Range   Alcohol, Ethyl (B) <11  0 - 11 mg/dL   Comment:            LOWEST DETECTABLE LIMIT FOR     SERUM ALCOHOL IS 11 mg/dL     FOR MEDICAL PURPOSES ONLY  ACETAMINOPHEN LEVEL     Status: None   Collection Time    03/29/14  1:06 AM      Result Value Ref Range   Acetaminophen (Tylenol), Serum <15.0  10 - 30 ug/mL   Comment:            THERAPEUTIC CONCENTRATIONS VARY     SIGNIFICANTLY. A RANGE OF 10-30     ug/mL MAY BE AN EFFECTIVE     CONCENTRATION FOR MANY PATIENTS.     HOWEVER, SOME ARE BEST TREATED     AT CONCENTRATIONS OUTSIDE THIS     RANGE.     ACETAMINOPHEN CONCENTRATIONS     >150 ug/mL AT 4 HOURS AFTER     INGESTION AND >50 ug/mL AT 12     HOURS AFTER INGESTION ARE     OFTEN ASSOCIATED WITH TOXIC     REACTIONS.  SALICYLATE LEVEL     Status: Abnormal   Collection Time    03/29/14  1:06 AM      Result Value Ref Range   Salicylate Lvl <0.2 (*) 2.8 - 20.0 mg/dL  URINE RAPID DRUG SCREEN (HOSP PERFORMED)     Status: None   Collection Time    03/29/14  1:16 AM      Result Value Ref Range   Opiates NONE DETECTED  NONE DETECTED   Cocaine NONE DETECTED  NONE DETECTED   Benzodiazepines NONE DETECTED  NONE DETECTED   Amphetamines NONE DETECTED  NONE DETECTED   Tetrahydrocannabinol NONE DETECTED  NONE DETECTED   Barbiturates  NONE DETECTED  NONE DETECTED   Comment:            DRUG SCREEN FOR MEDICAL PURPOSES     ONLY.  IF CONFIRMATION IS NEEDED     FOR ANY PURPOSE, NOTIFY LAB     WITHIN 5 DAYS.                LOWEST DETECTABLE LIMITS     FOR URINE DRUG SCREEN     Drug Class       Cutoff (ng/mL)     Amphetamine      1000     Barbiturate      200     Benzodiazepine   510     Tricyclics       258     Opiates          300     Cocaine          300     THC              50   Labs are reviewed and are pertinent for no  medical issues noted.  Current Facility-Administered Medications  Medication Dose Route Frequency Provider Last Rate Last Dose  . benztropine (COGENTIN) tablet 1 mg  1 mg Oral BID Mojeed Akintayo   1 mg at 03/29/14 2134  . diphenhydrAMINE (BENADRYL) injection 50 mg  50 mg Intramuscular Q8H PRN Mojeed Akintayo      . LORazepam (ATIVAN) injection 2 mg  2 mg Intramuscular Q8H PRN Mojeed Akintayo      . risperiDONE (RISPERDAL) tablet 2 mg  2 mg Oral BID Mojeed Akintayo   2 mg at 03/29/14 2134  . traZODone (DESYREL) tablet 50 mg  50 mg Oral QHS PRN Mojeed Akintayo      . ziprasidone (GEODON) injection 10 mg  10 mg Intramuscular Q8H PRN Mojeed Akintayo       Current Outpatient Prescriptions  Medication Sig Dispense Refill  . losartan-hydrochlorothiazide (HYZAAR) 50-12.5 MG per tablet Take 1 tablet by mouth daily.  30 tablet  0    Psychiatric Specialty Exam:     Blood pressure 125/77, pulse 74, temperature 98.2 F (36.8 C), temperature source Oral, resp. rate 16, SpO2 97.00%.There is no weight on file to calculate BMI.  General Appearance: Casual  Eye Contact::  Fair  Speech:  Normal Rate  Volume:  Increased  Mood:  Angry, Anxious and Irritable  Affect:  Congruent  Thought Process:  Disorganized   Orientation:  Full (Time, Place, and Person)  Thought Content:  Paranoid Ideation  Suicidal Thoughts:  No  Homicidal Thoughts:  No  Memory:  Immediate;   Fair Recent;   Fair Remote;   Fair  Judgement:  Poor  Insight:  Lacking  Psychomotor Activity:  Normal  Concentration:  Fair  Recall:  [place]: Fair  Akathisia:  No  Handed:  Right  AIMS (if indicated):     Assets:  Leisure Time Physical Health Resilience  Sleep:      Musculoskeletal: Strength & Muscle Tone: within normal limits Gait & Station: normal Patient leans: N/A  Treatment Plan Summary: Daily contact with patient to assess and evaluate symptoms and progress in treatment Medication  management; admit to inpatient psychiatry for stabilization.  Waylan Boga, [place] 03/30/2014 8:46 AM  Patient is seen face-to-face for psychiatric evaluation and case discussed with physician extender and treatment team, and formulated treatment plan. Reviewed the information documented and agree with  the treatment plan.  Krystopher Kuenzel,JANARDHAHA R. 03/30/2014 6:09 PM

## 2014-03-30 NOTE — ED Notes (Addendum)
Dr Shela Commons in w/ pt, pt yelling at him but calmed

## 2014-03-30 NOTE — Progress Notes (Signed)
The following facilities have been contacted regarding bed availability for inptx:  [facility]- per [place] currently at capacity but will call if anything should change [facility]- per Baird Lyons at [employer]- per [facility] available, referral faxed [facility]- per Dorathy Daft at capacity [facility]- per [facility] beds available but no aggression [facility]- per Bradd Burner can fax, referral faxed [facility]- per Joyce Gross can fax for review, referral faxed [facility] Rh[facility]- per Rosey Bath at [employer]- per [place] at [facility]- per [place] at capacity and already have "an extensive wait list" [facility]- per [place] [facility]- per [facility] at [facility]- per Dannielle Huh at [employer]- per [place], may have a few d/c's and can fax, referral faxed [facility]- per [facility]f[place]io Mylar no longer accept referrals [facility]- per Dahlia Client at capacity [facility]- per De Hollingshead at [employer]- per [facility] requested referral be faxed, referral faxed   Tomi Bamberger Disposition MHT

## 2014-03-30 NOTE — ED Notes (Signed)
Pt's sister into talk w/ Catha Nottingham NP

## 2014-03-30 NOTE — ED Notes (Signed)
Up to the desk on the phone 

## 2014-03-30 NOTE — ED Notes (Signed)
Pt agreed to take his medication --PA updated give AM dose he declined now and tonight's as scheduled--VORB Catha Nottingham NP

## 2014-03-30 NOTE — BH Assessment (Addendum)
Per Binnie Rail, [facility], adult unit is currently at capacity. Contacted the following facilities for placement:  BED AVAILABLE, FAXED CLINICAL INFORMATION: [employer]. Per Tresa Endo  AT CAPACITY: [facility], per Alfredia Client, per [facility], per [facility], per [facility], per [facility], per [facility], per [facility], per [facility], per [facility], per [facility], per Kennieth Rad, per [facility], per [facility], per [facility].  MULTIPLE ATTEMPTS TO CONTACT WITH NO RESPONSE: [facility]   [address] [place], [place], [facility] Triage Specialist [phone]

## 2014-03-30 NOTE — ED Notes (Signed)
Pt talking w/ sister, verbally loud/angry, no physical agression noted, lower tone of voice when remined

## 2014-03-31 ENCOUNTER — Encounter (HOSPITAL_COMMUNITY): Payer: Self-pay | Admitting: Registered Nurse

## 2014-03-31 DIAGNOSIS — F29 Unspecified psychosis not due to a substance or known physiological condition: Secondary | ICD-10-CM | POA: Diagnosis not present

## 2014-03-31 DIAGNOSIS — F200 Paranoid schizophrenia: Secondary | ICD-10-CM | POA: Diagnosis not present

## 2014-03-31 DIAGNOSIS — F22 Delusional disorders: Secondary | ICD-10-CM | POA: Diagnosis not present

## 2014-03-31 MED ORDER — RISPERIDONE 2 MG PO TABS: 2.0000 mg | ORAL_TABLET | Freq: Two times a day (BID) | ORAL | Status: DC

## 2014-03-31 MED ORDER — TRAZODONE HCL 50 MG PO TABS: 50.0000 mg | ORAL_TABLET | Freq: Every evening | ORAL | Status: DC | PRN

## 2014-03-31 MED ORDER — BENZTROPINE MESYLATE 1 MG PO TABS: 1.0000 mg | ORAL_TABLET | Freq: Two times a day (BID) | ORAL | Status: DC

## 2014-03-31 NOTE — ED Notes (Signed)
TTS into talk w/ pt 

## 2014-03-31 NOTE — ED Notes (Signed)
In activity room watching tv 

## 2014-03-31 NOTE — BH Assessment (Signed)
Binnie Rail, [facility] at [facility], confirmed adult unit is at capacity. Contacted the following facilities for placement:   BED AVAILABLE, CLINICAL INFORMATION HAS BEEN RECEIVED AS IS UNDER REVIEW:  [employer], per [facility], per [employer]   AT CAPACITY:  [facility], per Earl Lites, per [facility], per [facility], per [facility], per [facility] per [facility], per [facility], per [facility], per [facility], per [facility], per [facility], per Burnice Logan  [facility], per [facility], per [facility], per [place]   MULTIPLE ATTEMPTS TO CONTACT WITH NO RESPONSE:  [facility]  [facility]   PT DECLINED:  [facility] [address] [place]y [place], [facility], [facility]  Triage Specialist  [phone]

## 2014-03-31 NOTE — ED Notes (Signed)
On the phone 

## 2014-03-31 NOTE — ED Notes (Signed)
Spoke with sister. Stated she would pick up for d/c .

## 2014-03-31 NOTE — ED Notes (Signed)
Up to the bathroom 

## 2014-03-31 NOTE — ED Notes (Signed)
Pt became angry, talking loudly while on the phone, sitting quietly in activity room at this time

## 2014-03-31 NOTE — ED Notes (Signed)
Dr j and shuvon into see 

## 2014-03-31 NOTE — Discharge Instructions (Signed)
Confusion Confusion is the inability to think with your usual speed or clarity. Confusion may come on quickly or slowly over time. How quickly the confusion comes on depends on the cause. Confusion can be due to any number of causes. CAUSES   Concussion, head injury, or head trauma.  Seizures.  Stroke.  Fever.  Brain tumor.  Age related decreased brain function (dementia).  Heightened emotional states like rage or terror.  Mental illness in which the person loses the ability to determine what is real and what is not (hallucinations).  Infections such as a urinary tract infection (UTI).  Toxic effects from alcohol, drugs, or prescription medicines.  Dehydration and an imbalance of salts in the body (electrolytes).  Lack of sleep.  Low blood sugar (diabetes).  Low levels of oxygen from conditions such as chronic lung disorders.  Drug interactions or other medicine side effects.  Nutritional deficiencies, especially niacin, thiamine, vitamin C, or vitamin B.  Sudden drop in body temperature (hypothermia).  Change in routine, such as when traveling or hospitalized. SIGNS AND SYMPTOMS  People often describe their thinking as cloudy or unclear when they are confused. Confusion can also include feeling disoriented. That means you are unaware of where or who you are. You may also not know what the date or time is. If confused, you may also have difficulty paying attention, remembering, and making decisions. Some people also act aggressively when they are confused.  DIAGNOSIS  The medical evaluation of confusion may include:  Blood and urine tests.  X-rays.  Brain and nervous system tests.  Analyzing your brain waves (electroencephalogram or EEG).  Magnetic resonance imaging (MRI) of your head.  Computed tomography (CT) scan of your head.  Mental status tests in which your health care provider may ask many questions. Some of these questions may seem silly or strange,  but they are a very important test to help diagnose and treat confusion. TREATMENT  An admission to the hospital may not be needed, but a person with confusion should not be left alone. Stay with a family member or friend until the confusion clears. Avoid alcohol, pain relievers, or sedative drugs until you have fully recovered. Do not drive until directed by your health care provider. HOME CARE INSTRUCTIONS  What family and friends can do:  To find out if someone is confused, ask the person to state his or her name, age, and the date. If the person is unsure or answers incorrectly, he or she is confused.  Always introduce yourself, no matter how well the person knows you.  Often remind the person of his or her location.  Place a calendar and clock near the confused person.  Help the person with his or her medicines. You may want to use a pill box, an alarm as a reminder, or give the person each dose as prescribed.  Talk about current events and plans for the day.  Try to keep the environment calm, quiet, and peaceful.  Make sure the person keeps follow-up visits with his or her health care provider. PREVENTION  Ways to prevent confusion:  Avoid alcohol.  Eat a balanced diet.  Get enough sleep.  Take medicine only as directed by your health care provider.  Do not become isolated. Spend time with other people and make plans for your days.  Keep careful watch on your blood sugar levels if you are diabetic. SEEK IMMEDIATE MEDICAL CARE IF:   You develop severe headaches, repeated vomiting, seizures, blackouts, or  slurred speech.  There is increasing confusion, weakness, numbness, restlessness, or personality changes.  You develop a loss of balance, have marked dizziness, feel uncoordinated, or fall.  You have delusions, hallucinations, or develop severe anxiety.  Your family members think you need to be rechecked. Document Released: 08/12/2004 Document Revised: 11/19/2013  Document Reviewed: 08/10/2013 [facility] Patient Information 2015 [place], [place]. This information is not intended to replace advice given to you by your health care provider. Make sure you discuss any questions you have with your health care provider.  Psychosis Psychosis refers to a severe lack of understanding with reality. During a psychotic episode, you are not able to think clearly. During a psychotic episode, your responses and emotions are inappropriate and do not coincide with what is actually happening. You often have false beliefs about what is happening or who you are (delusions), and you may see, hear, taste, smell, or feel things that are not present (hallucinations). Psychosis is usually a severe symptom of a very serious mental health (psychiatric) condition, but it can sometimes be the result of a medical condition. CAUSES   Psychiatric conditions, such as:  Schizophrenia.  Bipolar disorder.  Depression.  Personality disorders.  Alcohol or drug abuse.  Medical conditions, such as:  Brain injury.  Brain tumor.  Dementia.  Brain diseases, such as Alzheimer's, Parkinson's, or Huntington's disease.  Neurological diseases, such as epilepsy.  Genetic disorders.  Metabolic disorders.  Infections that affect the brain.  Certain prescription drugs.  Stroke. SYMPTOMS   Unable to think or speak clearly or respond appropriately.  Disorganized thinking (thoughts jump from one thought to another).  Severe inappropriate behavior.  Delusions may include:  A strong belief that is odd, unrealistic, or false.  Feeling extremely fearful or suspicious (paranoid).  Believing you are someone else, have high importance, or have an altered identity.  Hallucinations. DIAGNOSIS   Mental health evaluation.  Physical exam.  Blood tests.  Computerized magnetic scan (MRI) or other brain scans. TREATMENT  Your caregiver will recommend a course of treatment that  depends on the cause of the psychosis. Treatment may include:  Monitoring and supportive care in the hospital.  Taking medicines (antipsychotic medicine) to reduce symptoms and balance chemicals in the brain.  Taking medicines to manage underlying mental health conditions.  Therapy and other supportive programs outside of the hospital.  Treating an underlying medical condition. If the cause of the psychosis can be treated or corrected, the outlook is good. Without treatment, psychotic episodes can cause danger to yourself or others. Treatment may be short-term or lifelong. HOME CARE INSTRUCTIONS   Take all medicines as directed. This is important.  Use a pillbox or write down your medicine schedule to make sure you are taking them.  Check with your caregiver before using over-the-counter medicines, herbs, or supplements.  Seek individual and family support through therapy and mental health education (psychoeducation) programs. These will help you manage symptoms and side effects of medicines, learn life skills, and maintain a healthy routine.  Maintain a healthy lifestyle.  Exercise regularly.  Avoid alcohol and drugs.  Learn ways to reduce stress and cope with stress, such as yoga and meditation.  Talk about your feelings with family members or caregivers.  Make time for yourself to do things you enjoy.  Know the early warning signs of psychosis. Your caregiver will recommend steps to take when you notice symptoms such as:  Feeling anxious or preoccupied.  Having racing thoughts.  Changes in your interest in  life and relationships.  Follow up with your caregivers for continued outpatient treatment as directed. SEEK MEDICAL CARE IF:   Medicines do not seem to be helping.  You hear voices telling you to do things.  You see, smell, or feel things that are not there.  You feel hopeless and overwhelmed.  You feel extremely fearful and suspicious that something will  harm you.  You feel like you cannot leave your house.  You have trouble taking care of yourself.  You experience side effects of medicines, such as changes in sleep patterns, dizziness, weight gain, restlessness, movement changes, muscle spasms, or tremors. SEEK IMMEDIATE MEDICAL CARE IF:  Severe psychotic symptoms present a safety issue (such as an urge to hurt yourself or others). MAKE SURE YOU:   Understand these instructions.  Will watch your condition.  Will get help right away if you are not doing well or get worse. FOR MORE INFORMATION  National Institute of Mental Health: http://www.maynard.net/ Document Released: 12/23/2009 Document Revised: 09/27/2011 Document Reviewed: 12/23/2009 [facility] Patient Information 2015 [place], [place]. This information is not intended to replace advice given to you by your health care provider. Make sure you discuss any questions you have with your health care provider.  Paranoia Paranoia is a distrust of others that is not based on a real reason for distrust. This may reach delusional levels. This means the paranoid person feels the world is against them when there is no reason to make them feel that way. People with paranoia feel as though people around them are "out to get them".  SIMILAR MENTAL ILNESSES  Depression is a feeling as though you are down all the time. It is normal in some situations where you have just lost a loved one. It is abnormal if you are having feelings of paranoia with it.  Dementia is a physical problem with the brain in which the brain no longer works properly. There are problems with daily activities of living. Alzheimer's disease is one example of this. Dementia is also caused by old age changes in the brain which come with the death of brain cells and small strokes.  Paranoidschizophrenia. People with paranoid schizophrenia and persecutory delusional disorder have delusions in which they feel people around them are plotting  against them. Persecutory delusions in paranoid schizophrenia are bizarre, sometimes grandiose, and often accompanied by auditory hallucinations. This means the person is hearing voices that are not there.  Delusionaldisorder (persecutory type). Delusions experienced by individuals with delusional disorder are more believable than those experienced by paranoid schizophrenics; they are not bizarre, though still unjustified. Individuals with delusional disorder may seem offbeat or quirky rather than mentally ill, and therefore, may never seek treatment. All of these problems usually do not allow these people to interact socially in an acceptable manner. CAUSES The cause of paranoia is often not known. It is common in people with extended abuse of:  Cocaine.  Amphetamine.  Marijuana.  Alcohol. Sometimes there is an inherited tendency. It may be associated with stress or changes in brain chemistry. DIAGNOSIS  When paranoia is present, your caregiver may:  Refer you to a specialist.  Do a physical exam.  Perform other tests on you to make sure there are not other problems causing the paranoia including:  Physical problems.  Mental problems.  Chemical problems (other than drugs). Testing may be done to determine if there is a psychiatric disability present that can be treated with medicine. TREATMENT   Paranoia that is a symptom  of a psychiatric problem should be treated by professionals.  Medicines are available which can help this disorder. Antipsychotic medicine may be prescribed by your caregiver.  Sometimes psychotherapy may be useful.  Conditions such as depression or drug abuse are treated individually. If the paranoia is caused by drug abuse, a treatment facility may be helpful. Depression may be helped by antidepressants. PROGNOSIS   Paranoid people are difficult to treat because of their belief that everyone is out to get them or harm them. Because of this mistrust, they  often must be talked into entering treatment by a trusted family member or friend. They may not want to take medicine as they may see this as an attempt to poison them.  Gradual gains in the trust of a therapist or caregiver helps in a successful treatment plan.  Some people with PPD or persecutory delusional disorder function in society without treatment in limited fashion. Document Released: 07/08/2003 Document Revised: 09/27/2011 Document Reviewed: 03/12/2008 [facility] Patient Information 2015 [place]. This information is not intended to replace advice given to you by your health care provider. Make sure you discuss any questions you have with your health care provider.

## 2014-03-31 NOTE — Consult Note (Signed)
[facility] Face-to-Face Psychiatry Consult   Reason for Consult:  Schizophrenia  Referring Physician:  EDP  Charles Cortez. is an 48 y.o. male. Total Time spent with patient: 20 minutes  Assessment: AXIS I:  schizophrenia AXIS II:  Deferred AXIS III:   Past Medical History  Diagnosis Date  . Paranoid schizophrenia   . Bipolar affective disorder    AXIS IV:  other psychosocial or environmental problems, problems related to social environment and problems with primary support group AXIS V:  51-60 moderate symptoms  Plan:  Recommend psychiatric Inpatient admission when medically cleared.Dr. Louretta Shorten assessed the patient and concurs with the plan.  Subjective:   Charles Cortez. is a 48 y.o. male patient admitted with exacerbation of schizophrenia.  HPI:  Patient states that he is feeling better.  States that the medication has help him to calm his mood.  Patient states that he is ready to be discharged home.   TTS spoke to  Patient sister and she is okay with patient to come home as long as he stays on his medication.  Patient sister also want patient to have prescription for medications until patient can follow up outpatient.  Patient denies suicidal/homicidal ideation, psychosis, paranoia.    Past Psychiatric History: Past Medical History  Diagnosis Date  . Paranoid schizophrenia   . Bipolar affective disorder     reports that he has been smoking.  He does not have any smokeless tobacco history on file. He reports that he does not drink alcohol or use illicit drugs. History reviewed. No pertinent family history.         Allergies:   Allergies  Allergen Reactions  . Fish-Derived Products   . Madelaine Bhat Isothiocyanate]   . Other      ketchup, soy sauce, peanuts  . Peanut-Containing Drug Products   . Pork-Derived Products Swelling    ACT Assessment Complete:  Yes:    Educational Status    Risk to Self: Risk to self with the past 6 months Is patient at  risk for suicide?: Yes Substance abuse history and/or treatment for substance abuse?: Yes  Risk to Others:    Abuse:    Prior Inpatient Therapy:    Prior Outpatient Therapy:    Additional Information:      Objective: Blood pressure 139/67, pulse 70, temperature 97.9 F (36.6 C), temperature source Oral, resp. rate 17, SpO2 99.00%.There is no weight on file to calculate BMI. Results for orders placed during the hospital encounter of 03/29/14 (from the past 72 hour(s))  CBC WITH DIFFERENTIAL     Status: Abnormal   Collection Time    03/29/14  1:06 AM      Result Value Ref Range   WBC 8.2  4.0 - 10.5 K/uL   RBC 4.54  4.22 - 5.81 MIL/uL   Hemoglobin 12.7 (*) 13.0 - 17.0 g/dL   HCT 36.5 (*) 39.0 - 52.0 %   MCV 80.4  78.0 - 100.0 fL   MCH 28.0  26.0 - 34.0 pg   MCHC 34.8  30.0 - 36.0 g/dL   RDW 12.7  11.5 - 15.5 %   Platelets 257  150 - 400 K/uL   Neutrophils Relative % 66  43 - 77 %   Neutro Abs 5.4  1.7 - 7.7 K/uL   Lymphocytes Relative 23  12 - 46 %   Lymphs Abs 1.9  0.7 - 4.0 K/uL   Monocytes Relative 10  3 - 12 %  Monocytes Absolute 0.8  0.1 - 1.0 K/uL   Eosinophils Relative 1  0 - 5 %   Eosinophils Absolute 0.1  0.0 - 0.7 K/uL   Basophils Relative 0  0 - 1 %   Basophils Absolute 0.0  0.0 - 0.1 K/uL  COMPREHENSIVE METABOLIC PANEL     Status: Abnormal   Collection Time    03/29/14  1:06 AM      Result Value Ref Range   Sodium 132 (*) 137 - 147 mEq/L   Potassium 3.9  3.7 - 5.3 mEq/L   Chloride 95 (*) 96 - 112 mEq/L   CO2 25  19 - 32 mEq/L   Glucose, Bld 131 (*) 70 - 99 mg/dL   BUN 11  6 - 23 mg/dL   Creatinine, Ser 0.91  0.50 - 1.35 mg/dL   Calcium 9.9  8.4 - 10.5 mg/dL   Total Protein 7.2  6.0 - 8.3 g/dL   Albumin 3.9  3.5 - 5.2 g/dL   AST 39 (*) 0 - 37 U/L   ALT 39  0 - 53 U/L   Alkaline Phosphatase 77  39 - 117 U/L   Total Bilirubin 0.4  0.3 - 1.2 mg/dL   GFR calc non Af Amer >90  >90 mL/min   GFR calc Af Amer >90  >90 mL/min   Comment: (NOTE)     The eGFR  has been calculated using the CKD EPI equation.     This calculation has not been validated in all clinical situations.     eGFR's persistently <90 mL/min signify possible Chronic Kidney     Disease.   Anion gap 12  5 - 15  ETHANOL     Status: None   Collection Time    03/29/14  1:06 AM      Result Value Ref Range   Alcohol, Ethyl (B) <11  0 - 11 mg/dL   Comment:            LOWEST DETECTABLE LIMIT FOR     SERUM ALCOHOL IS 11 mg/dL     FOR MEDICAL PURPOSES ONLY  ACETAMINOPHEN LEVEL     Status: None   Collection Time    03/29/14  1:06 AM      Result Value Ref Range   Acetaminophen (Tylenol), Serum <15.0  10 - 30 ug/mL   Comment:            THERAPEUTIC CONCENTRATIONS VARY     SIGNIFICANTLY. A RANGE OF 10-30     ug/mL MAY BE AN EFFECTIVE     CONCENTRATION FOR MANY PATIENTS.     HOWEVER, SOME ARE BEST TREATED     AT CONCENTRATIONS OUTSIDE THIS     RANGE.     ACETAMINOPHEN CONCENTRATIONS     >150 ug/mL AT 4 HOURS AFTER     INGESTION AND >50 ug/mL AT 12     HOURS AFTER INGESTION ARE     OFTEN ASSOCIATED WITH TOXIC     REACTIONS.  SALICYLATE LEVEL     Status: Abnormal   Collection Time    03/29/14  1:06 AM      Result Value Ref Range   Salicylate Lvl <8.9 (*) 2.8 - 20.0 mg/dL  URINE RAPID DRUG SCREEN (HOSP PERFORMED)     Status: None   Collection Time    03/29/14  1:16 AM      Result Value Ref Range   Opiates NONE DETECTED  NONE DETECTED   Cocaine NONE DETECTED  NONE DETECTED   Benzodiazepines NONE DETECTED  NONE DETECTED   Amphetamines NONE DETECTED  NONE DETECTED   Tetrahydrocannabinol NONE DETECTED  NONE DETECTED   Barbiturates NONE DETECTED  NONE DETECTED   Comment:            DRUG SCREEN FOR MEDICAL PURPOSES     ONLY.  IF CONFIRMATION IS NEEDED     FOR ANY PURPOSE, NOTIFY LAB     WITHIN 5 DAYS.                LOWEST DETECTABLE LIMITS     FOR URINE DRUG SCREEN     Drug Class       Cutoff (ng/mL)     Amphetamine      1000     Barbiturate      200      Benzodiazepine   366     Tricyclics       440     Opiates          300     Cocaine          300     THC              50   Labs are reviewed and are pertinent for no medical issues noted.  Current Facility-Administered Medications  Medication Dose Route Frequency Provider Last Rate Last Dose  . benztropine (COGENTIN) tablet 1 mg  1 mg Oral BID Mojeed Akintayo   1 mg at 03/31/14 0934  . diphenhydrAMINE (BENADRYL) injection 50 mg  50 mg Intramuscular Q8H PRN Mojeed Akintayo      . LORazepam (ATIVAN) injection 2 mg  2 mg Intramuscular Q8H PRN Mojeed Akintayo      . risperiDONE (RISPERDAL) tablet 2 mg  2 mg Oral BID Mojeed Akintayo   2 mg at 03/31/14 0934  . traZODone (DESYREL) tablet 50 mg  50 mg Oral QHS PRN Mojeed Akintayo      . ziprasidone (GEODON) injection 10 mg  10 mg Intramuscular Q8H PRN Mojeed Akintayo       Current Outpatient Prescriptions  Medication Sig Dispense Refill  . losartan-hydrochlorothiazide (HYZAAR) 50-12.5 MG per tablet Take 1 tablet by mouth daily.  30 tablet  0    Psychiatric Specialty Exam:     Blood pressure 139/67, pulse 70, temperature 97.9 F (36.6 C), temperature source Oral, resp. rate 17, SpO2 99.00%.There is no weight on file to calculate BMI.  General Appearance: Casual  Eye Contact::  Fair  Speech:  Normal Rate  Volume:  Increased  Mood:  Anxious  Affect:  Congruent  Thought Process:  Disorganized   Orientation:  Full (Time, Place, and Person)  Thought Content:  Rumination  Suicidal Thoughts:  No  Homicidal Thoughts:  No  Memory:  Immediate;   Fair Recent;   Fair Remote;   Fair  Judgement:  Poor  Insight:  Fair  Psychomotor Activity:  Normal  Concentration:  Fair  Recall:  [employer] of Knowledge:Fair  Language: Fair  Akathisia:  No  Handed:  Right  AIMS (if indicated):     Assets:  Leisure Time Physical Health Resilience Social Support  Sleep:      Musculoskeletal: Strength & Muscle Tone: within normal limits Gait & Station:  normal Patient leans: N/A  Treatment Plan Summary: Discharge home with sister.  Follow up with resources given for outpatient services.  RX:  Risperdal 2 mg twice daily, Trazodone 20 mg qhs, and Cogentin 1 mg  bid.    Earleen Newport, FNP-BC 03/31/2014 5:02 PM  Patient is in face-to-face for psychiatric evaluation, case discussed with the treatment team and a physician extender and formulated treatment plan.Reviewed the information documented and agree with the treatment plan.  Manette Doto,JANARDHAHA R. 03/31/2014 5:49 PM

## 2014-03-31 NOTE — [facility] Suicide Risk Assessment (Cosign Needed)
Suicide Risk Assessment  Discharge Assessment     Demographic Factors:  Male  Total Time spent with patient: 30 minutes      Blood pressure 139/67, pulse 70, temperature 97.9 F (36.6 C), temperature source Oral, resp. rate 17, SpO2 99.00%.There is no weight on file to calculate BMI.   General Appearance: Casual   Eye Contact:: Fair   Speech: Normal Rate   Volume: Increased   Mood: Anxious   Affect: Congruent   Thought Process: Disorganized   Orientation: Full (Time, Place, and Person)   Thought Content: Rumination   Suicidal Thoughts: No   Homicidal Thoughts: No   Memory: Immediate; Fair  Recent; Fair  Remote; Fair   Judgement: Poor   Insight: Fair   Psychomotor Activity: Normal   Concentration: Fair   Recall: [employer] of Knowledge:Fair   Language: Fair   Akathisia: No   Handed: Right   AIMS (if indicated):   Assets: Leisure Time  Physical Health  Resilience  Social Support   Sleep:   Musculoskeletal:  Strength & Muscle Tone: within normal limits  Gait & Station: normal  Patient leans: N/A Psychiatric Specialty Exam:  Mental Status Per Nursing Assessment::   On Admission:     Current Mental Status by Physician: Patient denies suicidal/homicidal ideaton, psychosis, and paranoia  Loss Factors: NA  Historical Factors: NA  Risk Reduction Factors:   Sense of responsibility to family, Living with another person, especially a relative and Positive social support  Continued Clinical Symptoms:  Previous Psychiatric Diagnoses and Treatments  Cognitive Features That Contribute To Risk:  Closed-mindedness    Suicide Risk:  Minimal: No identifiable suicidal ideation.  Patients presenting with no risk factors but with morbid ruminations; may be classified as minimal risk based on the severity of the depressive symptoms  Discharge Diagnoses: AXIS I: schizophrenia  AXIS II: Deferred  AXIS III:  Past Medical History   Diagnosis  Date   .  Paranoid  schizophrenia    .  Bipolar affective disorder     AXIS IV: other psychosocial or environmental problems, problems related to social environment and problems with primary support group  AXIS V: 51-60 moderate symptoms     Plan Of Care/Follow-up recommendations:  Activity:  as tolerated Diet:  as tolerated  Is patient on multiple antipsychotic therapies at discharge:  No   Has Patient had three or more failed trials of antipsychotic monotherapy by history:  No  Recommended Plan for Multiple Antipsychotic Therapies: NA    Rankin, Shuvon, FNP-BC 03/31/2014, 5:12 PM

## 2014-04-01 DIAGNOSIS — Z23 Encounter for immunization: Secondary | ICD-10-CM | POA: Diagnosis not present

## 2014-04-16 DIAGNOSIS — F29 Unspecified psychosis not due to a substance or known physiological condition: Secondary | ICD-10-CM | POA: Diagnosis not present

## 2014-05-05 ENCOUNTER — Other Ambulatory Visit (HOSPITAL_COMMUNITY): Payer: Self-pay | Admitting: Psychiatry

## 2014-05-06 ENCOUNTER — Other Ambulatory Visit (HOSPITAL_COMMUNITY): Payer: Self-pay | Admitting: Psychiatry

## 2014-05-14 DIAGNOSIS — F250 Schizoaffective disorder, bipolar type: Secondary | ICD-10-CM | POA: Diagnosis not present

## 2014-05-14 DIAGNOSIS — F29 Unspecified psychosis not due to a substance or known physiological condition: Secondary | ICD-10-CM | POA: Diagnosis not present

## 2014-07-06 ENCOUNTER — Emergency Department (HOSPITAL_COMMUNITY)
Admission: EM | Admit: 2014-07-06 | Discharge: 2014-07-08 | Disposition: A | Discharge status: Home / Self Care | Payer: Medicare Other | Attending: Emergency Medicine | Admitting: Emergency Medicine

## 2014-07-06 ENCOUNTER — Encounter (HOSPITAL_COMMUNITY): Payer: Self-pay | Admitting: *Deleted

## 2014-07-06 DIAGNOSIS — Z720 Tobacco use: Secondary | ICD-10-CM | POA: Diagnosis not present

## 2014-07-06 DIAGNOSIS — F319 Bipolar disorder, unspecified: Secondary | ICD-10-CM | POA: Diagnosis not present

## 2014-07-06 DIAGNOSIS — F200 Paranoid schizophrenia: Secondary | ICD-10-CM | POA: Diagnosis not present

## 2014-07-06 DIAGNOSIS — R443 Hallucinations, unspecified: Secondary | ICD-10-CM | POA: Diagnosis present

## 2014-07-06 DIAGNOSIS — F911 Conduct disorder, childhood-onset type: Secondary | ICD-10-CM | POA: Diagnosis not present

## 2014-07-06 DIAGNOSIS — F29 Unspecified psychosis not due to a substance or known physiological condition: Secondary | ICD-10-CM | POA: Diagnosis not present

## 2014-07-06 DIAGNOSIS — Z79899 Other long term (current) drug therapy: Secondary | ICD-10-CM | POA: Diagnosis not present

## 2014-07-06 DIAGNOSIS — F419 Anxiety disorder, unspecified: Secondary | ICD-10-CM | POA: Insufficient documentation

## 2014-07-06 DIAGNOSIS — R51 Headache: Secondary | ICD-10-CM | POA: Insufficient documentation

## 2014-07-06 DIAGNOSIS — F209 Schizophrenia, unspecified: Secondary | ICD-10-CM | POA: Diagnosis not present

## 2014-07-06 LAB — RAPID URINE DRUG SCREEN, HOSP PERFORMED
Amphetamines: NOT DETECTED
BARBITURATES: NOT DETECTED
Benzodiazepines: NOT DETECTED
Cocaine: NOT DETECTED
Opiates: NOT DETECTED
Tetrahydrocannabinol: NOT DETECTED

## 2014-07-06 LAB — CBC
HCT: 39.9 % (ref 39.0–52.0)
HEMOGLOBIN: 13.7 g/dL (ref 13.0–17.0)
MCH: 27.5 pg (ref 26.0–34.0)
MCHC: 34.3 g/dL (ref 30.0–36.0)
MCV: 80.1 fL (ref 78.0–100.0)
Platelets: 258 10*3/uL (ref 150–400)
RBC: 4.98 MIL/uL (ref 4.22–5.81)
RDW: 13 % (ref 11.5–15.5)
WBC: 6.9 10*3/uL (ref 4.0–10.5)

## 2014-07-06 LAB — COMPREHENSIVE METABOLIC PANEL
ALBUMIN: 4.9 g/dL (ref 3.5–5.2)
ALK PHOS: 88 U/L (ref 39–117)
ALT: 22 U/L (ref 0–53)
ANION GAP: 15 (ref 5–15)
AST: 28 U/L (ref 0–37)
BILIRUBIN TOTAL: 0.3 mg/dL (ref 0.3–1.2)
BUN: 7 mg/dL (ref 6–23)
CO2: 22 meq/L (ref 19–32)
CREATININE: 0.91 mg/dL (ref 0.50–1.35)
Calcium: 10.5 mg/dL (ref 8.4–10.5)
Chloride: 94 mEq/L — ABNORMAL LOW (ref 96–112)
GFR calc Af Amer: >90 mL/min (ref >90)
GLUCOSE: 117 mg/dL — AB (ref 70–99)
Potassium: 4 mEq/L (ref 3.7–5.3)
Sodium: 131 mEq/L — ABNORMAL LOW (ref 137–147)
Total Protein: 8.1 g/dL (ref 6.0–8.3)

## 2014-07-06 LAB — SALICYLATE LEVEL: Salicylate Lvl: <2 mg/dL — ABNORMAL LOW (ref 2.8–20.0)

## 2014-07-06 LAB — ETHANOL

## 2014-07-06 LAB — ACETAMINOPHEN LEVEL

## 2014-07-06 MED ORDER — RISPERIDONE 2 MG PO TABS
2.0000 mg | ORAL_TABLET | Freq: Two times a day (BID) | ORAL | Status: DC
  Administered 2014-07-08: 2 mg via ORAL
  Filled 2014-07-06 (×2): qty 1

## 2014-07-06 MED ORDER — BENZTROPINE MESYLATE 1 MG PO TABS
1.0000 mg | ORAL_TABLET | Freq: Two times a day (BID) | ORAL | Status: DC
  Administered 2014-07-08: 1 mg via ORAL
  Filled 2014-07-06 (×2): qty 1

## 2014-07-06 MED ORDER — LORAZEPAM 1 MG PO TABS: 1.0000 mg | ORAL_TABLET | Freq: Three times a day (TID) | ORAL | Status: DC | PRN

## 2014-07-06 MED ORDER — LOSARTAN POTASSIUM-HCTZ 50-12.5 MG PO TABS: 1.0000 | ORAL_TABLET | Freq: Every day | ORAL | Status: DC

## 2014-07-06 MED ORDER — ZIPRASIDONE MESYLATE 20 MG IM SOLR
10.0000 mg | Freq: Once | INTRAMUSCULAR | Status: AC
  Administered 2014-07-06: 10 mg via INTRAMUSCULAR

## 2014-07-06 MED ORDER — ZIPRASIDONE MESYLATE 20 MG IM SOLR
INTRAMUSCULAR | Status: AC
  Filled 2014-07-06: qty 20

## 2014-07-06 MED ORDER — HYDROCHLOROTHIAZIDE 12.5 MG PO CAPS
12.5000 mg | ORAL_CAPSULE | Freq: Every day | ORAL | Status: DC
  Administered 2014-07-08: 12.5 mg via ORAL
  Filled 2014-07-06 (×2): qty 1

## 2014-07-06 MED ORDER — STERILE WATER FOR INJECTION IJ SOLN
INTRAMUSCULAR | Status: AC
  Administered 2014-07-06: 10 mL
  Filled 2014-07-06: qty 10

## 2014-07-06 MED ORDER — LOSARTAN POTASSIUM 50 MG PO TABS
50.0000 mg | ORAL_TABLET | Freq: Every day | ORAL | Status: DC
  Administered 2014-07-08: 50 mg via ORAL
  Filled 2014-07-06 (×2): qty 1

## 2014-07-06 MED ORDER — TRAZODONE HCL 50 MG PO TABS
50.0000 mg | ORAL_TABLET | Freq: Every evening | ORAL | Status: DC | PRN
  Filled 2014-07-06: qty 1

## 2014-07-06 NOTE — ED Notes (Signed)
Spoke w/ pt's sister who took out IVC papers on pt.  Pt's sister informed this Clinical research associatewriter that d/t other family stressors pt would need some type of placement at d/c as pt is non-compliant w/ psych meds.

## 2014-07-06 NOTE — BH Assessment (Signed)
Received call for assessment. Spoke with Dr. Rolan BuccoMelanie Belfi who said Pt has not been taking his psychiatric medications and is acutely psychotic. He was agitated at admission and given Geodon. Tele-assessment will be initiated.  Harlin RainFord Ellis Ria CommentWarrick Jr, LPC, [facility] Triage Specialist [phone]

## 2014-07-06 NOTE — ED Notes (Signed)
Pt brought in via [employer] under IVC - pt's sister took out IVC papers on pt d/t pt w/ hx of schizophrenia w/ paranoia - pt prescribed psych meds which improve his behavior however pt has not been taking his medications. Pt seeing demons and reacts w/ possibly dangerous behaviors to include running, hiding, removing clothing, bothering neighbors. Pt also states he tries to avoid snipers who he believes to be hiding in the woods. Upon assessment pt is uncooperative, flight of ideas, excessively loud, mildly agitated - refusing to answer questions, refusing staff to obtain vital signs stating the blood pressure cuff is making him agitated.

## 2014-07-06 NOTE — ED Notes (Addendum)
Pt has shoes, shirts, pants, underwear, sweater, and socks.   Pt has been seen and wanded by security.

## 2014-07-06 NOTE — ED Provider Notes (Signed)
CSN: [id number]     Arrival date & time 07/06/14  1940 History   First MD Initiated Contact with Patient 07/06/14 2015     Chief Complaint  Patient presents with  . Paranoid  . Hallucinations     (Consider location/radiation/quality/duration/timing/severity/associated sxs/prior Treatment) HPI Comments: Pt with a hx of schizophrenia presents with psychotic behavior.  He was brought in on an IVC taken out by his sister.  Per the papers, she says that he is not taking his medication and is becoming increasingly paranoid.  Is not taking care of himself.  Is trying to avoid snipers in the woods.  Pt denies medical complaints other than "a little tension on the side of my head".  He says that he isn't taking his medicine because the only medicine that he needs is God.   Past Medical History  Diagnosis Date  . Paranoid schizophrenia   . Bipolar affective disorder    Past Surgical History  Procedure Laterality Date  . Knee surgery    . Finger surgery     History reviewed. No pertinent family history. History  Substance Use Topics  . Smoking status: Current Every Day Smoker -- 0.50 packs/day  . Smokeless tobacco: Not on file  . Alcohol Use: No    Review of Systems  Constitutional: Negative for fever, chills, diaphoresis and fatigue.  HENT: Negative for congestion, rhinorrhea and sneezing.   Eyes: Negative.   Respiratory: Negative for cough, chest tightness and shortness of breath.   Cardiovascular: Negative for chest pain and leg swelling.  Gastrointestinal: Negative for nausea, vomiting, abdominal pain, diarrhea and blood in stool.  Genitourinary: Negative for frequency, hematuria, flank pain and difficulty urinating.  Musculoskeletal: Negative for back pain and arthralgias.  Skin: Negative for rash.  Neurological: Positive for headaches. Negative for dizziness, speech difficulty, weakness and numbness.      Allergies  Fish-derived products; Mustard; Other; Peanut-containing  drug products; and Pork-derived products  Home Medications   Prior to Admission medications   Medication Sig Start Date End Date Taking? Authorizing Provider  benztropine (COGENTIN) 1 MG tablet Take 1 tablet (1 mg total) by mouth 2 (two) times daily. 03/31/14  Yes Shuvon Rankin, NP  risperiDONE (RISPERDAL) 2 MG tablet Take 1 tablet (2 mg total) by mouth 2 (two) times daily. 03/31/14  Yes Shuvon Rankin, NP  traZODone (DESYREL) 50 MG tablet Take 1 tablet (50 mg total) by mouth at bedtime as needed for sleep. 03/31/14  Yes Shuvon Rankin, NP  losartan-hydrochlorothiazide (HYZAAR) 50-12.5 MG per tablet Take 1 tablet by mouth daily. 12/04/13   Kaitlyn Szekalski, PA-C   BP 118/75 mmHg  Pulse 87  Temp(Src) 97.9 F (36.6 C) (Oral)  Resp 16  SpO2 100% Physical Exam  Constitutional: He is oriented to person, place, and time. He appears well-developed and well-nourished.  HENT:  Head: Normocephalic and atraumatic.  Eyes: Pupils are equal, round, and reactive to light.  Neck: Normal range of motion. Neck supple.  Cardiovascular: Normal rate, regular rhythm and normal heart sounds.   Pulmonary/Chest: Effort normal and breath sounds normal. No respiratory distress. He has no wheezes. He has no rales. He exhibits no tenderness.  Abdominal: Soft. Bowel sounds are normal. There is no tenderness. There is no rebound and no guarding.  Musculoskeletal: Normal range of motion. He exhibits no edema.  Lymphadenopathy:    He has no cervical adenopathy.  Neurological: He is alert and oriented to person, place, and time.  Skin: Skin is warm and  dry. No rash noted.  Psychiatric: His mood appears anxious. His speech is rapid and/or pressured. He is aggressive and actively hallucinating. Thought content is paranoid.    ED Course  Procedures (including critical care time) Labs Review Results for orders placed or performed during the hospital encounter of 07/06/14  Acetaminophen level  Result Value Ref Range    Acetaminophen (Tylenol), Serum <15.0 10 - 30 ug/mL  CBC  Result Value Ref Range   WBC 6.9 4.0 - 10.5 K/uL   RBC 4.98 4.22 - 5.81 MIL/uL   Hemoglobin 13.7 13.0 - 17.0 g/dL   HCT 16.139.9 09.639.0 - 04.552.0 %   MCV 80.1 78.0 - 100.0 fL   MCH 27.5 26.0 - 34.0 pg   MCHC 34.3 30.0 - 36.0 g/dL   RDW 40.913.0 81.111.5 - 91.415.5 %   Platelets 258 150 - 400 K/uL  Comprehensive metabolic panel  Result Value Ref Range   Sodium 131 (L) 137 - 147 mEq/L   Potassium 4.0 3.7 - 5.3 mEq/L   Chloride 94 (L) 96 - 112 mEq/L   CO2 22 19 - 32 mEq/L   Glucose, Bld 117 (H) 70 - 99 mg/dL   BUN 7 6 - 23 mg/dL   Creatinine, Ser 7.820.91 0.50 - 1.35 mg/dL   Calcium 95.610.5 8.4 - 21.310.5 mg/dL   Total Protein 8.1 6.0 - 8.3 g/dL   Albumin 4.9 3.5 - 5.2 g/dL   AST 28 0 - 37 U/L   ALT 22 0 - 53 U/L   Alkaline Phosphatase 88 39 - 117 U/L   Total Bilirubin 0.3 0.3 - 1.2 mg/dL   GFR calc non Af Amer >90 >90 mL/min   GFR calc Af Amer >90 >90 mL/min   Anion gap 15 5 - 15  Ethanol (ETOH)  Result Value Ref Range   Alcohol, Ethyl (B) <11 0 - 11 mg/dL  Salicylate level  Result Value Ref Range   Salicylate Lvl <2.0 (L) 2.8 - 20.0 mg/dL  Urine Drug Screen  Result Value Ref Range   Opiates NONE DETECTED NONE DETECTED   Cocaine NONE DETECTED NONE DETECTED   Benzodiazepines NONE DETECTED NONE DETECTED   Amphetamines NONE DETECTED NONE DETECTED   Tetrahydrocannabinol NONE DETECTED NONE DETECTED   Barbiturates NONE DETECTED NONE DETECTED   No results found.    Imaging Review No results found.   EKG Interpretation None      MDM   Final diagnoses:  Paranoid schizophrenia   Pt given Geodon in the ED due to his agitated state.  Will check labs and consult TTS.    Rolan BuccoMelanie Lashayla Armes, MD 07/06/14 [phone]

## 2014-07-06 NOTE — BH Assessment (Signed)
Contacted Oneita JollyKari Ward, RN to arrange tele-assessment. She said [facility]  Harlin RainFord Ellis Ria CommentWarrick Jr, LPC, [facility] Triage Specialist [phone]

## 2014-07-06 NOTE — BH Assessment (Signed)
Assessment complete. Brook ConverseMcNichol[facility], confirms adult unit is currently at capacity. Gave clinical report to Maryjean Mornharles Kober, PA-C who recommended Pt be evaluated by psychiatry in the morning. Notified Dr. Rolan BuccoMelanie Belfi and [facility] staff of recommendation.  Harlin RainFord Ellis Ria CommentWarrick Jr, LPC, [facility] Triage Specialist [phone]

## 2014-07-06 NOTE — ED Notes (Signed)
Security to bedside to wand pt and his belongings.

## 2014-07-06 NOTE — ED Notes (Signed)
Pt is pacing around the room and refusing to sit down.  [employer] at bedside.

## 2014-07-06 NOTE — BH Assessment (Addendum)
Tele Assessment Note   Charles Cortez. is an 48 y.o. male, African-American who presents to [facility] ED via Patent examiner after Pt's sister, [name] [phone] petitioned for involuntary commitment. Per Affidavit and Petition for IVC:   "Previous hospitalization (when?). Relatively recently diagnosed schizophrenia with paranoia; prescribed meds led to improvement; now off meds and is deteriorating and is unable to properly care for himself without assistance.: sees demons and reacts with possibly dangeous behaviors (running, hiding, removing clothing, bothering neighbors); tries to avoid snipers who believes to be in the woods."  Pt states he doesn't know why he was brought to the ED, that he was watching a movie on the couch of his apartment and law enforcement forced him to come. Pt acknowledges he was agitated earlier because "I was just watching a movie, trying to sleep and they disturbed me." Pt states he is currently outpatient at [facility] and his next appointment is in January. He reports he doesn't need medication because he is no longer on drugs. 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 He denies alcohol use. He denies suicidal ideation or history of suicide attempts. He denies homicidal ideation or history of violence. He denies auditory or visual hallucinations. He denies anxiety or feeling paranoid. Pt describes his mood as "fine." He states he is on disability and he has difficulty paying his rent. He cannot identify any other stressors. Pt states he lives alone and does have family who is supportive.  Pt was given Geodon in the ED and became increasingly drowsy during assessment. Pt is dressed in hospital scrubs and wrapped in a blanket. He is drowsy, oriented x4 with normal speech and normal motor behavior. Eye contact is poor. Pt's mood is slightly irritable and affect is congruent with mood. Thought process is generally coherent  with some flight of ideas. Pt has decreased concentration. Pt was unable to recall the name of his psychiatrist, when or where he has been hospitalized in the past. Pt states he wants to return to his residence and go to bed.Pt was generally cooperative before eventually becoming too drowsy to respond to questions.  Attempted to contact Pt's sister, [name], for collateral information but was connected to unidentified voicemail. No message was left.   Axis I: 295.90 Schizophrenia Axis II: Deferred Axis III:  Past Medical History  Diagnosis Date  . Paranoid schizophrenia   . Bipolar affective disorder    Axis IV: economic problems, other psychosocial or environmental problems and problems with access to health care services Axis V: GAF=25  Past Medical History:  Past Medical History  Diagnosis Date  . Paranoid schizophrenia   . Bipolar affective disorder     Past Surgical History  Procedure Laterality Date  . Knee surgery    . Finger surgery      Family History: History reviewed. No pertinent family history.  Social History:  reports that he has been smoking.  He does not have any smokeless tobacco history on file. He reports that he does not drink alcohol or use illicit drugs.  Additional Social History:  Alcohol / Drug Use Pain Medications: Denies abuse Prescriptions: Denies abuse Over the Counter: Denies abuse History of alcohol / drug use?: Yes (Pt reports he has a history of abusing drugs but denies use in 12 years) Longest period of sobriety (when/how long): 12 years  CIWA: CIWA-Ar BP: 118/75 mmHg Pulse Rate: 87 COWS:    PATIENT  STRENGTHS: (choose at least two) Average or above average intelligence Capable of independent living Communication skills Physical Health Supportive family/friends  Allergies:  Allergies  Allergen Reactions  . Fish-Derived Products   . Arlice ColtMustard [Allyl Isothiocyanate]   . Other      ketchup, soy sauce, peanuts  .  Peanut-Containing Drug Products   . Pork-Derived Products Swelling    Home Medications:  (Not in a hospital admission)  OB/GYN Status:  No LMP for male patient.  General Assessment Data Location of Assessment: [facility] ED Is this a Tele or Face-to-Face Assessment?: Tele Assessment Is this an Initial Assessment or a Re-assessment for this encounter?: Initial Assessment Living Arrangements: Alone Can pt return to current living arrangement?: Yes Admission Status: Involuntary Is patient capable of signing voluntary admission?: No Transfer from: Home Referral Source: Self/Family/Friend     [facility] Living Arrangements: Alone Name of Psychiatrist: "[facility]" Name of Therapist: None  Education Status Is patient currently in school?: No Current Grade: NA Highest grade of school patient has completed: NA Name of school: NA Contact person: NA  Risk to self with the past 6 months Suicidal Ideation: No Suicidal Intent: No Is patient at risk for suicide?: No Suicidal Plan?: No Access to Means: No What has been your use of drugs/alcohol within the last 12 months?: Pt reports last using drugs 12 years ago Previous Attempts/Gestures: No How many times?: 0 Other Self Harm Risks: Per sister, Pt has been engaging in dangerous behaviors, see note Triggers for Past Attempts: None known Intentional Self Injurious Behavior: None Family Suicide History: Unknown Recent stressful life event(s): Financial Problems Persecutory voices/beliefs?: Yes Depression: Yes Depression Symptoms: Feeling angry/irritable Substance abuse history and/or treatment for substance abuse?: Yes Suicide prevention information given to non-admitted patients: Not applicable  Risk to Others within the past 6 months Homicidal Ideation: No Thoughts of Harm to Others: No Current Homicidal Intent: No Current Homicidal Plan: No Access to Homicidal Means: No Identified Victim: None History of harm to others?:  No Assessment of Violence: None Noted Violent Behavior Description: Pt denies history of violence Does patient have access to weapons?: No Criminal Charges Pending?: No Does patient have a court date: No  Psychosis Hallucinations: Visual (Sister reports Pt is seeing demons) Delusions: Persecutory (Sister reports Pt is paranoid)  Mental Status Report Appear/Hygiene: In scrubs, Other (Comment) (Wrapped in blanket) Eye Contact: Poor Motor Activity: Unremarkable Speech: Logical/coherent Level of Consciousness: Drowsy Mood: Other (Comment) (Pt reports mood as "fine") Affect: Irritable Anxiety Level: Minimal Thought Processes: Coherent, Relevant Judgement: Partial Orientation: Person, Place, Time Obsessive Compulsive Thoughts/Behaviors: None  Cognitive Functioning Concentration: Decreased Memory: Recent Intact, Remote Impaired IQ: Average Insight: Poor Impulse Control: Poor Appetite: Good Weight Loss: 0 Weight Gain: 0 Sleep: Decreased Total Hours of Sleep: 0 (Pt could not estimate) Vegetative Symptoms: Decreased grooming  ADLScreening [facility] Patient's cognitive ability adequate to safely complete daily activities?: Yes Patient able to express need for assistance with ADLs?: Yes Independently performs ADLs?: Yes (appropriate for developmental age)  Prior Inpatient Therapy Prior Inpatient Therapy: Yes Prior Therapy Dates: Pt cannot remember Prior Therapy Facilty/Provider(s): Unknown Reason for Treatment: Schizophrenia  Prior Outpatient Therapy Prior Outpatient Therapy: Yes Prior Therapy Dates: Current Prior Therapy Facilty/Provider(s): [facility] Reason for Treatment: Schizophrenia  ADL Screening (condition at time of admission) Patient's cognitive ability adequate to safely complete daily activities?: Yes Is the patient deaf or have difficulty hearing?: No Does the patient have difficulty seeing, even when wearing glasses/contacts?: No Does  the  patient have difficulty concentrating, remembering, or making decisions?: No Patient able to express need for assistance with ADLs?: Yes Does the patient have difficulty dressing or bathing?: No Independently performs ADLs?: Yes (appropriate for developmental age) Does the patient have difficulty walking or climbing stairs?: No Weakness of Legs: None Weakness of Arms/Hands: None  Home Assistive Devices/Equipment Home Assistive Devices/Equipment: None    Abuse/Neglect Assessment (Assessment to be complete while patient is alone) Physical Abuse: Denies Verbal Abuse: Denies Sexual Abuse: Denies Exploitation of patient/patient's resources: Denies Self-Neglect: Denies     Merchant navy officerAdvance Directives (For Healthcare) Does patient have an advance directive?: No Would patient like information on creating an advanced directive?: No - patient declined information    Additional Information 1:1 In Past 12 Months?: No CIRT Risk: Yes Elopement Risk: Yes Does patient have medical clearance?: Yes     Disposition: Brook McNichol, [facility], confirms adult unit is currently at capacity. Gave clinical report to Maryjean Mornharles Kober, PA-C who recommended Pt be evaluated by psychiatry in the morning. Notified Dr. Rolan BuccoMelanie Belfi and [facility] staff of recommendation.  Disposition Initial Assessment Completed for this Encounter: Yes Disposition of Patient: Other dispositions Other disposition(s): Other (Comment) (Pt be evaluated by psychiatry in the morning.)   Harlin RainFord Ellis Patsy BaltimoreWarrick Jr, [facility], [facility] Triage Specialist [phone]   Pamalee LeydenWarrick Jr, Greysyn Vanderberg Ellis 07/06/2014 11:43 PM

## 2014-07-07 ENCOUNTER — Encounter (HOSPITAL_COMMUNITY): Payer: Self-pay | Admitting: Psychiatry

## 2014-07-07 DIAGNOSIS — F29 Unspecified psychosis not due to a substance or known physiological condition: Secondary | ICD-10-CM

## 2014-07-07 DIAGNOSIS — F200 Paranoid schizophrenia: Secondary | ICD-10-CM | POA: Diagnosis not present

## 2014-07-07 MED ORDER — LORAZEPAM 1 MG PO TABS: 1.0000 mg | ORAL_TABLET | ORAL | Status: DC | PRN

## 2014-07-07 MED ORDER — ZIPRASIDONE MESYLATE 20 MG IM SOLR
10.0000 mg | Freq: Once | INTRAMUSCULAR | Status: DC
  Administered 2014-07-07: 10 mg via INTRAMUSCULAR
  Filled 2014-07-07: qty 20

## 2014-07-07 MED ORDER — LORAZEPAM 2 MG/ML IJ SOLN
INTRAMUSCULAR | Status: AC
  Administered 2014-07-07: 2 mg
  Filled 2014-07-07: qty 1

## 2014-07-07 MED ORDER — ZIPRASIDONE MESYLATE 20 MG IM SOLR
20.0000 mg | INTRAMUSCULAR | Status: DC | PRN
  Filled 2014-07-07: qty 20

## 2014-07-07 MED ORDER — LORAZEPAM 2 MG/ML IJ SOLN: 1.0000 mg | Freq: Once | INTRAMUSCULAR | Status: DC

## 2014-07-07 MED ORDER — LORAZEPAM 2 MG/ML IJ SOLN
2.0000 mg | Freq: Four times a day (QID) | INTRAMUSCULAR | Status: DC | PRN
  Filled 2014-07-07: qty 1

## 2014-07-07 MED ORDER — DIPHENHYDRAMINE HCL 50 MG/ML IJ SOLN: 50.0000 mg | Freq: Once | INTRAMUSCULAR | Status: DC

## 2014-07-07 MED ORDER — OLANZAPINE 10 MG PO TBDP: 10.0000 mg | ORAL_TABLET | Freq: Three times a day (TID) | ORAL | Status: DC | PRN

## 2014-07-07 NOTE — ED Notes (Signed)
Pt refusing all meds.  Awake, alert & responsive, defensive & guarded, will monitor for safety.

## 2014-07-07 NOTE — Consult Note (Signed)
[name] Face-to-Face Psychiatry Consult   Reason for Consult:  Psychosis  Referring Physician:  EDP [name]. is an 48 y.o. male. Total Time spent with patient: 45 minutes  Assessment: AXIS I:  Psychotic Disorder NOS AXIS II:  Deferred AXIS III:   Past Medical History  Diagnosis Date  . Paranoid schizophrenia   . Bipolar affective disorder    AXIS IV:  occupational problems, problems related to social environment and problems with access to health care services AXIS V:  21-30 behavior considerably influenced by delusions or hallucinations OR serious impairment in judgment, communication OR inability to function in almost all areas  Plan:  Recommend psychiatric Inpatient admission when medically cleared.  Subjective:   Charles Cortez. is a 48 y.o. male patient admitted with increased psychosis.  HPI:   Patient presented to [facility] under IVC for states, that he was watching a movie on the couch of his apartment and law enforcement forced him to come. Patient reports that he "was surrounded by 20 police officers and became agitated.  Patient is angry with sister who filed papers as she brought police officers into his home.  Patient presents as tangential in conversation with pressured speech.  Patient holds an apple core that he reports he had in his hand at the time he was brought to the ED and will not put this down or throw away stating "this represents [place] and it is an apple state"  Patient is oriented x4 with slightly agitated and noted to be pacing in and out of room.  Thought process is generally coherent with some flight of ideas. Pt has decreased concentration. Pt was unable to recall the name of his psychiatrist, when or where he has been hospitalized in the past. Pt states he wants to return to his residence and go to bed.  Denies suicidal homicidal ideation denies AVH although at times appears to be attending to internal stimuli.  Patient is slightly paranoid    HPI Elements:   Location:  generalized. Quality:  acute. Severity:  severe. Timing:  contant. Duration:  exacerbation over the past few weeks. Context:  medication non-compliance psychsocial stressors .  Past Psychiatric History: Past Medical History  Diagnosis Date  . Paranoid schizophrenia   . Bipolar affective disorder     reports that he has been smoking.  He does not have any smokeless tobacco history on file. He reports that he does not drink alcohol or use illicit drugs. History reviewed. No pertinent family history. Family History Substance Abuse: No (Unknown) Family Supports: Yes, List: (Sister) Living Arrangements: Alone Can pt return to current living arrangement?: Yes Abuse/Neglect [facility]) Physical Abuse: Denies Verbal Abuse: Denies Sexual Abuse: Denies Allergies:   Allergies  Allergen Reactions  . Fish-Derived Products   . Madelaine Bhat Isothiocyanate]   . Other      ketchup, soy sauce, peanuts  . Peanut-Containing Drug Products   . Pork-Derived Products Swelling    ACT Assessment Complete:  Yes:    Educational Status    Risk to Self: Risk to self with the past 6 months Suicidal Ideation: No Suicidal Intent: No Is patient at risk for suicide?: No Suicidal Plan?: No Access to Means: No What has been your use of drugs/alcohol within the last 12 months?: Pt reports last using drugs 12 years ago Previous Attempts/Gestures: No How many times?: 0 Other Self Harm Risks: Per sister, Pt has been engaging in dangerous behaviors, see note Triggers for Past Attempts:  None known Intentional Self Injurious Behavior: None Family Suicide History: Unknown Recent stressful life event(s): Financial Problems Persecutory voices/beliefs?: Yes Depression: Yes Depression Symptoms: Feeling angry/irritable Substance abuse history and/or treatment for substance abuse?: Yes Suicide prevention information given to non-admitted patients: Not applicable  Risk to Others: Risk  to Others within the past 6 months Homicidal Ideation: No Thoughts of Harm to Others: No Current Homicidal Intent: No Current Homicidal Plan: No Access to Homicidal Means: No Identified Victim: None History of harm to others?: No Assessment of Violence: None Noted Violent Behavior Description: Pt denies history of violence Does patient have access to weapons?: No Criminal Charges Pending?: No Does patient have a court date: No  Abuse: Abuse/Neglect Assessment (Assessment to be complete while patient is alone) Physical Abuse: Denies Verbal Abuse: Denies Sexual Abuse: Denies Exploitation of patient/patient's resources: Denies Self-Neglect: Denies  Prior Inpatient Therapy: Prior Inpatient Therapy Prior Inpatient Therapy: Yes Prior Therapy Dates: Pt cannot remember Prior Therapy Facilty/Provider(s): Unknown Reason for Treatment: Schizophrenia  Prior Outpatient Therapy: Prior Outpatient Therapy Prior Outpatient Therapy: Yes Prior Therapy Dates: Current Prior Therapy Facilty/Provider(s): [facility] Reason for Treatment: Schizophrenia  Additional Information: Additional Information 1:1 In Past 12 Months?: No CIRT Risk: Yes Elopement Risk: Yes Does patient have medical clearance?: Yes                  Objective: Blood pressure 118/75, pulse 109, temperature 97.7 F (36.5 C), temperature source Oral, resp. rate 20, SpO2 100 %.There is no weight on file to calculate BMI. Results for orders placed or performed during the hospital encounter of 07/06/14 (from the past 72 hour(s))  Acetaminophen level     Status: None   Collection Time: 07/06/14  8:10 PM  Result Value Ref Range   Acetaminophen (Tylenol), Serum <15.0 10 - 30 ug/mL    Comment:        THERAPEUTIC CONCENTRATIONS VARY SIGNIFICANTLY. A RANGE OF 10-30 ug/mL MAY BE AN EFFECTIVE CONCENTRATION FOR MANY PATIENTS. HOWEVER, SOME ARE BEST TREATED AT CONCENTRATIONS OUTSIDE THIS RANGE. ACETAMINOPHEN  CONCENTRATIONS >150 ug/mL AT 4 HOURS AFTER INGESTION AND >50 ug/mL AT 12 HOURS AFTER INGESTION ARE OFTEN ASSOCIATED WITH TOXIC REACTIONS.   CBC     Status: None   Collection Time: 07/06/14  8:10 PM  Result Value Ref Range   WBC 6.9 4.0 - 10.5 K/uL   RBC 4.98 4.22 - 5.81 MIL/uL   Hemoglobin 13.7 13.0 - 17.0 g/dL   HCT 39.9 39.0 - 52.0 %   MCV 80.1 78.0 - 100.0 fL   MCH 27.5 26.0 - 34.0 pg   MCHC 34.3 30.0 - 36.0 g/dL   RDW 13.0 11.5 - 15.5 %   Platelets 258 150 - 400 K/uL  Comprehensive metabolic panel     Status: Abnormal   Collection Time: 07/06/14  8:10 PM  Result Value Ref Range   Sodium 131 (L) 137 - 147 mEq/L   Potassium 4.0 3.7 - 5.3 mEq/L   Chloride 94 (L) 96 - 112 mEq/L   CO2 22 19 - 32 mEq/L   Glucose, Bld 117 (H) 70 - 99 mg/dL   BUN 7 6 - 23 mg/dL   Creatinine, Ser 0.91 0.50 - 1.35 mg/dL   Calcium 10.5 8.4 - 10.5 mg/dL   Total Protein 8.1 6.0 - 8.3 g/dL   Albumin 4.9 3.5 - 5.2 g/dL   AST 28 0 - 37 U/L   ALT 22 0 - 53 U/L   Alkaline Phosphatase 88 39 -  117 U/L   Total Bilirubin 0.3 0.3 - 1.2 mg/dL   GFR calc non Af Amer >90 >90 mL/min   GFR calc Af Amer >90 >90 mL/min    Comment: (NOTE) The eGFR has been calculated using the CKD EPI equation. This calculation has not been validated in all clinical situations. eGFR's persistently <90 mL/min signify possible Chronic Kidney Disease.    Anion gap 15 5 - 15  Ethanol (ETOH)     Status: None   Collection Time: 07/06/14  8:10 PM  Result Value Ref Range   Alcohol, Ethyl (B) <11 0 - 11 mg/dL    Comment:        LOWEST DETECTABLE LIMIT FOR SERUM ALCOHOL IS 11 mg/dL FOR MEDICAL PURPOSES ONLY   Salicylate level     Status: Abnormal   Collection Time: 07/06/14  8:10 PM  Result Value Ref Range   Salicylate Lvl <6.8 (L) 2.8 - 20.0 mg/dL  Urine Drug Screen     Status: None   Collection Time: 07/06/14  8:41 PM  Result Value Ref Range   Opiates NONE DETECTED NONE DETECTED   Cocaine NONE DETECTED NONE DETECTED    Benzodiazepines NONE DETECTED NONE DETECTED   Amphetamines NONE DETECTED NONE DETECTED   Tetrahydrocannabinol NONE DETECTED NONE DETECTED   Barbiturates NONE DETECTED NONE DETECTED    Comment:        DRUG SCREEN FOR MEDICAL PURPOSES ONLY.  IF CONFIRMATION IS NEEDED FOR ANY PURPOSE, NOTIFY LAB WITHIN 5 DAYS.        LOWEST DETECTABLE LIMITS FOR URINE DRUG SCREEN Drug Class       Cutoff (ng/mL) Amphetamine      1000 Barbiturate      200 Benzodiazepine   127 Tricyclics       517 Opiates          300 Cocaine          300 THC              50    Labs are reviewed and are pertinent for medical issues being treated .  Current Facility-Administered Medications  Medication Dose Route Frequency Provider Last Rate Last Dose  . benztropine (COGENTIN) tablet 1 mg  1 mg Oral BID Malvin Johns, MD   1 mg at 07/06/14 2308  . diphenhydrAMINE (BENADRYL) injection 50 mg  50 mg Intravenous Once Kennedy Bucker, NP   50 mg at 07/07/14 1150  . losartan (COZAAR) tablet 50 mg  50 mg Oral Daily Malvin Johns, MD   50 mg at 07/07/14 1020   And  . hydrochlorothiazide (MICROZIDE) capsule 12.5 mg  12.5 mg Oral Daily Malvin Johns, MD   12.5 mg at 07/07/14 1020  . LORazepam (ATIVAN) injection 2 mg  2 mg Intravenous Q6H PRN Kennedy Bucker, NP      . OLANZapine zydis (ZYPREXA) disintegrating tablet 10 mg  10 mg Oral Q8H PRN Kennedy Bucker, NP       And  . LORazepam (ATIVAN) tablet 1 mg  1 mg Oral PRN Kennedy Bucker, NP       And  . ziprasidone (GEODON) injection 20 mg  20 mg Intramuscular PRN Kennedy Bucker, NP      . risperiDONE (RISPERDAL) tablet 2 mg  2 mg Oral BID Malvin Johns, MD   2 mg at 07/06/14 2308  . traZODone (DESYREL) tablet 50 mg  50 mg Oral QHS PRN Malvin Johns, MD       Current Outpatient Prescriptions  Medication  Sig Dispense Refill  . benztropine (COGENTIN) 1 MG tablet Take 1 tablet (1 mg total) by mouth 2 (two) times daily. 30 tablet 0  . risperiDONE (RISPERDAL) 2 MG tablet Take 1 tablet  (2 mg total) by mouth 2 (two) times daily. 60 tablet 0  . traZODone (DESYREL) 50 MG tablet Take 1 tablet (50 mg total) by mouth at bedtime as needed for sleep. 30 tablet 0  . losartan-hydrochlorothiazide (HYZAAR) 50-12.5 MG per tablet Take 1 tablet by mouth daily. 30 tablet 0    Psychiatric Specialty Exam:     Blood pressure 118/75, pulse 109, temperature 97.7 F (36.5 C), temperature source Oral, resp. rate 20, SpO2 100 %.There is no weight on file to calculate BMI.  General Appearance: Casual and Fairly Groomed  Eye Contact::  Good  Speech:  Clear and Coherent and Pressured  Volume:  Normal  Mood:  Euthymic and Irritable  Affect:  Congruent  Thought Process:  Circumstantial, Goal Directed and some flight of ideas  Orientation:  Full (Time, Place, and Person)  Thought Content:  Delusions, Hallucinations: denies auditory or visual hallucinations although appears to attend to stimuli at times .  patient is grandiose  and Paranoid Ideation  Suicidal Thoughts:  No  Homicidal Thoughts:  No  Memory:  Immediate;   Good Recent;   Fair Remote;   Fair  Judgement:  Impaired  Insight:  Lacking  Psychomotor Activity:  Increased  Concentration:  Poor  Recall:  [place]: Fair  Akathisia:  No  Handed:  Right  AIMS (if indicated):     Assets:  Armed forces logistics/support/administrative officer Housing Leisure Time Social Support  Sleep:      Musculoskeletal: Strength & Muscle Tone: within normal limits Gait & Station: normal Patient leans: N/A  Treatment Plan Summary: Daily contact with patient to assess and evaluate symptoms and progress in treatment Medication management recommend inpatient hospitalization for stabilization of mood / thought processes   Kennedy Bucker  PMH-NP  07/07/2014 4:58 PM

## 2014-07-07 NOTE — ED Notes (Signed)
Continues to be loud and difficult to redirect.  [employer] and security on unit.  EDP contacted for medication support if needed.

## 2014-07-07 NOTE — Progress Notes (Signed)
CSW faxed referrals to:  Pending: [facility]   [facility]    Adelene AmasEdith Tayjah Lobdell, [place] Disposition Social Worker [phone]

## 2014-07-07 NOTE — ED Notes (Signed)
Charles Cortez is pacing and agitated at times.  States he is "not going to take any medications."  Redirected and meal given.

## 2014-07-08 DIAGNOSIS — F200 Paranoid schizophrenia: Secondary | ICD-10-CM | POA: Diagnosis not present

## 2014-07-08 DIAGNOSIS — F209 Schizophrenia, unspecified: Secondary | ICD-10-CM | POA: Diagnosis not present

## 2014-07-08 MED ORDER — ZIPRASIDONE MESYLATE 20 MG IM SOLR
10.0000 mg | Freq: Once | INTRAMUSCULAR | Status: AC
  Administered 2014-07-08: 10 mg via INTRAMUSCULAR
  Filled 2014-07-08: qty 20

## 2014-07-08 MED ORDER — LORAZEPAM 2 MG/ML IJ SOLN
2.0000 mg | Freq: Once | INTRAMUSCULAR | Status: AC
  Administered 2014-07-08: 2 mg via INTRAMUSCULAR
  Filled 2014-07-08: qty 1

## 2014-07-08 NOTE — [facility] Suicide Risk Assessment (Signed)
Suicide Risk Assessment  Discharge Assessment     Demographic Factors:  Male  Total Time spent with patient: 30 minutes  Psychiatric Specialty Exam:     Blood pressure 101/76, pulse 84, temperature 97.7 F (36.5 C), temperature source Oral, resp. rate 18, SpO2 100 %.There is no weight on file to calculate BMI.  General Appearance: Casual and Fairly Groomed  Eye Contact::  Good  Speech:  Clear and coherent  Volume:  Normal  Mood:  Euthymic  Affect:  Congruent  Thought Process:  Coherent, logical  Orientation:  Full (Time, Place, and Person)  Thought Content:  WDL  Suicidal Thoughts:  No  Homicidal Thoughts:  No  Memory: Good  Judgement: Fair  Insight:  Fair  Psychomotor Activity:  Normal  Concentration:  Good  Recall:  Good  Fund of Knowledge:Fair  Language: Good  Akathisia:  No  Handed:  Right  AIMS (if indicated):     Assets:  Manufacturing systems engineerCommunication Skills Housing Leisure Time Social Support  Sleep:      Musculoskeletal: Strength & Muscle Tone: within normal limits Gait & Station: normal Patient leans: N/A  Mental Status Per Nursing Assessment::   On Admission:   Paranoia  Current Mental Status by Physician: NA  Loss Factors: NA  Historical Factors: NA  Risk Reduction Factors:   Sense of responsibility to family, Positive social support and Positive therapeutic relationship  Continued Clinical Symptoms:  None  Cognitive Features That Contribute To Risk:  None  Suicide Risk:  Minimal: No identifiable suicidal ideation.  Patients presenting with no risk factors but with morbid ruminations; may be classified as minimal risk based on the severity of the depressive symptoms  Discharge Diagnoses:   AXIS I:  Schizophrenia, unspecified type AXIS II:  Deferred AXIS III:   Past Medical History  Diagnosis Date  . Paranoid schizophrenia   . Bipolar affective disorder    AXIS IV:  other psychosocial or environmental problems and problems related to social  environment AXIS V:  61-70 mild symptoms  Plan Of Care/Follow-up recommendations:  Activity:  as tolerated Diet:  heart healthy diet  Is patient on multiple antipsychotic therapies at discharge:  No   Has Patient had three or more failed trials of antipsychotic monotherapy by history:  No  Recommended Plan for Multiple Antipsychotic Therapies: NA    LORD, JAMISON, PMH-NP 07/08/2014, 12:15 PM

## 2014-07-08 NOTE — ED Notes (Addendum)
Pt agitated, pacing, talking loud. PA [employer] notified.  Security & [employer] at bedside for assistance.

## 2014-07-08 NOTE — ED Notes (Signed)
Pt d/c home to follow up with [facility] in the community. Pt was alert, ambulatory, without physical distress to note at present. Cooperative with d/c procedure. All d/c instructions reviewed with pt and understanding verbalized. Belongings in [address] [address] given to pt and property sheet signed in agreement to items received. Vitals done and documented. Safety maintained on Q 15 minutes checks as ordered till time of D/C.

## 2014-07-08 NOTE — ED Notes (Signed)
Pt standing in hallway at present.

## 2014-07-08 NOTE — Discharge Instructions (Signed)
For your ongoing mental health needs, continue your treatment with [facility]:       [facility]      [address]      [place], [place] [address]      [phone]

## 2014-07-08 NOTE — Consult Note (Signed)
[facility] Face-to-Face Psychiatry Consult   Reason for Consult:  Psychosis Referring Physician:  EDP Rilley Stash. is an 48 y.o. male. Total Time spent with patient: 30 minutes  Assessment: AXIS I:  Schizophrenia, unspecified type AXIS II:  Deferred AXIS III:   Past Medical History  Diagnosis Date  . Paranoid schizophrenia   . Bipolar affective disorder    AXIS IV:  occupational problems, problems related to social environment and problems with access to health care services AXIS V: 70; mild symptoms  Plan:  Discharge home with follow-up with his regular providers at [facility].  Subjective:   Choice Kleinsasser. is a 48 y.o. male patient has stabilized and requests discharge. HPI:   Patient denies suicidal/homicidal ideations, hallucinations, and alcohol/drug abuse.  He wants to discharge to get "back to work".  Teyton sees [facility] for his care and will continue.  His sister is concerned because he stabilizes on his medications but stops taking them and ends up back in the hospital.  He was on injectables in the spring but started refusing them.  The social worker encouraged her to go to [facility] with the patient and advocate for an ACT team to assist with is non-compliance.  Past Psychiatric History: Past Medical History  Diagnosis Date  . Paranoid schizophrenia   . Bipolar affective disorder     reports that he has been smoking.  He does not have any smokeless tobacco history on file. He reports that he does not drink alcohol or use illicit drugs. History reviewed. No pertinent family history. Family History Substance Abuse: No (Unknown) Family Supports: Yes, List: (Sister) Living Arrangements: Alone Can pt return to current living arrangement?: Yes Abuse/Neglect [facility]) Physical Abuse: Denies Verbal Abuse: Denies Sexual Abuse: Denies Allergies:   Allergies  Allergen Reactions  . Fish-Derived Products   . Madelaine Bhat Isothiocyanate]   . Other      ketchup, soy sauce,  peanuts  . Peanut-Containing Drug Products   . Pork-Derived Products Swelling    ACT Assessment Complete:  Yes:    Educational Status    Risk to Self: Risk to self with the past 6 months Suicidal Ideation: No Suicidal Intent: No Is patient at risk for suicide?: No Suicidal Plan?: No Access to Means: No What has been your use of drugs/alcohol within the last 12 months?: Pt reports last using drugs 12 years ago Previous Attempts/Gestures: No How many times?: 0 Other Self Harm Risks: Per sister, Pt has been engaging in dangerous behaviors, see note Triggers for Past Attempts: None known Intentional Self Injurious Behavior: None Family Suicide History: Unknown Recent stressful life event(s): Financial Problems Persecutory voices/beliefs?: Yes Depression: Yes Depression Symptoms: Feeling angry/irritable Substance abuse history and/or treatment for substance abuse?: No Suicide prevention information given to non-admitted patients: Not applicable  Risk to Others: Risk to Others within the past 6 months Homicidal Ideation: No Thoughts of Harm to Others: No Current Homicidal Intent: No Current Homicidal Plan: No Access to Homicidal Means: No Identified Victim: None History of harm to others?: No Assessment of Violence: None Noted Violent Behavior Description: Pt denies history of violence Does patient have access to weapons?: No Criminal Charges Pending?: No Does patient have a court date: No  Abuse: Abuse/Neglect Assessment (Assessment to be complete while patient is alone) Physical Abuse: Denies Verbal Abuse: Denies Sexual Abuse: Denies Exploitation of patient/patient's resources: Denies Self-Neglect: Denies  Prior Inpatient Therapy: Prior Inpatient Therapy Prior Inpatient Therapy: Yes Prior Therapy Dates: Pt cannot remember  Prior Therapy Facilty/Provider(s): Unknown Reason for Treatment: Schizophrenia  Prior Outpatient Therapy: Prior Outpatient Therapy Prior Outpatient  Therapy: Yes Prior Therapy Dates: Current Prior Therapy Facilty/Provider(s): [facility] Reason for Treatment: Schizophrenia  Additional Information: Additional Information 1:1 In Past 12 Months?: No CIRT Risk: Yes Elopement Risk: Yes Does patient have medical clearance?: Yes                  Objective: Blood pressure 101/76, pulse 84, temperature 97.7 F (36.5 C), temperature source Oral, resp. rate 18, SpO2 100 %.There is no weight on file to calculate BMI. Results for orders placed or performed during the hospital encounter of 07/06/14 (from the past 72 hour(s))  Acetaminophen level     Status: None   Collection Time: 07/06/14  8:10 PM  Result Value Ref Range   Acetaminophen (Tylenol), Serum <15.0 10 - 30 ug/mL    Comment:        THERAPEUTIC CONCENTRATIONS VARY SIGNIFICANTLY. A RANGE OF 10-30 ug/mL MAY BE AN EFFECTIVE CONCENTRATION FOR MANY PATIENTS. HOWEVER, SOME ARE BEST TREATED AT CONCENTRATIONS OUTSIDE THIS RANGE. ACETAMINOPHEN CONCENTRATIONS >150 ug/mL AT 4 HOURS AFTER INGESTION AND >50 ug/mL AT 12 HOURS AFTER INGESTION ARE OFTEN ASSOCIATED WITH TOXIC REACTIONS.   CBC     Status: None   Collection Time: 07/06/14  8:10 PM  Result Value Ref Range   WBC 6.9 4.0 - 10.5 K/uL   RBC 4.98 4.22 - 5.81 MIL/uL   Hemoglobin 13.7 13.0 - 17.0 g/dL   HCT 39.9 39.0 - 52.0 %   MCV 80.1 78.0 - 100.0 fL   MCH 27.5 26.0 - 34.0 pg   MCHC 34.3 30.0 - 36.0 g/dL   RDW 13.0 11.5 - 15.5 %   Platelets 258 150 - 400 K/uL  Comprehensive metabolic panel     Status: Abnormal   Collection Time: 07/06/14  8:10 PM  Result Value Ref Range   Sodium 131 (L) 137 - 147 mEq/L   Potassium 4.0 3.7 - 5.3 mEq/L   Chloride 94 (L) 96 - 112 mEq/L   CO2 22 19 - 32 mEq/L   Glucose, Bld 117 (H) 70 - 99 mg/dL   BUN 7 6 - 23 mg/dL   Creatinine, Ser 0.91 0.50 - 1.35 mg/dL   Calcium 10.5 8.4 - 10.5 mg/dL   Total Protein 8.1 6.0 - 8.3 g/dL   Albumin 4.9 3.5 - 5.2 g/dL   AST 28 0 - 37 U/L   ALT  22 0 - 53 U/L   Alkaline Phosphatase 88 39 - 117 U/L   Total Bilirubin 0.3 0.3 - 1.2 mg/dL   GFR calc non Af Amer >90 >90 mL/min   GFR calc Af Amer >90 >90 mL/min    Comment: (NOTE) The eGFR has been calculated using the CKD EPI equation. This calculation has not been validated in all clinical situations. eGFR's persistently <90 mL/min signify possible Chronic Kidney Disease.    Anion gap 15 5 - 15  Ethanol (ETOH)     Status: None   Collection Time: 07/06/14  8:10 PM  Result Value Ref Range   Alcohol, Ethyl (B) <11 0 - 11 mg/dL    Comment:        LOWEST DETECTABLE LIMIT FOR SERUM ALCOHOL IS 11 mg/dL FOR MEDICAL PURPOSES ONLY   Salicylate level     Status: Abnormal   Collection Time: 07/06/14  8:10 PM  Result Value Ref Range   Salicylate Lvl <1.3 (L) 2.8 - 20.0 mg/dL  Urine Drug Screen     Status: None   Collection Time: 07/06/14  8:41 PM  Result Value Ref Range   Opiates NONE DETECTED NONE DETECTED   Cocaine NONE DETECTED NONE DETECTED   Benzodiazepines NONE DETECTED NONE DETECTED   Amphetamines NONE DETECTED NONE DETECTED   Tetrahydrocannabinol NONE DETECTED NONE DETECTED   Barbiturates NONE DETECTED NONE DETECTED    Comment:        DRUG SCREEN FOR MEDICAL PURPOSES ONLY.  IF CONFIRMATION IS NEEDED FOR ANY PURPOSE, NOTIFY LAB WITHIN 5 DAYS.        LOWEST DETECTABLE LIMITS FOR URINE DRUG SCREEN Drug Class       Cutoff (ng/mL) Amphetamine      1000 Barbiturate      200 Benzodiazepine   638 Tricyclics       453 Opiates          300 Cocaine          300 THC              50    Labs are reviewed and are pertinent for medical issues being treated .  Current Facility-Administered Medications  Medication Dose Route Frequency Provider Last Rate Last Dose  . benztropine (COGENTIN) tablet 1 mg  1 mg Oral BID Malvin Johns, MD   1 mg at 07/08/14 1013  . diphenhydrAMINE (BENADRYL) injection 50 mg  50 mg Intravenous Once Kennedy Bucker, NP   50 mg at 07/07/14 1150  .  losartan (COZAAR) tablet 50 mg  50 mg Oral Daily Malvin Johns, MD   50 mg at 07/08/14 1013   And  . hydrochlorothiazide (MICROZIDE) capsule 12.5 mg  12.5 mg Oral Daily Malvin Johns, MD   12.5 mg at 07/08/14 1013  . LORazepam (ATIVAN) injection 2 mg  2 mg Intravenous Q6H PRN Kennedy Bucker, NP      . OLANZapine zydis (ZYPREXA) disintegrating tablet 10 mg  10 mg Oral Q8H PRN Kennedy Bucker, NP       And  . LORazepam (ATIVAN) tablet 1 mg  1 mg Oral PRN Kennedy Bucker, NP       And  . ziprasidone (GEODON) injection 20 mg  20 mg Intramuscular PRN Kennedy Bucker, NP      . risperiDONE (RISPERDAL) tablet 2 mg  2 mg Oral BID Malvin Johns, MD   2 mg at 07/08/14 1013  . traZODone (DESYREL) tablet 50 mg  50 mg Oral QHS PRN Malvin Johns, MD       Current Outpatient Prescriptions  Medication Sig Dispense Refill  . benztropine (COGENTIN) 1 MG tablet Take 1 tablet (1 mg total) by mouth 2 (two) times daily. 30 tablet 0  . risperiDONE (RISPERDAL) 2 MG tablet Take 1 tablet (2 mg total) by mouth 2 (two) times daily. 60 tablet 0  . traZODone (DESYREL) 50 MG tablet Take 1 tablet (50 mg total) by mouth at bedtime as needed for sleep. 30 tablet 0  . losartan-hydrochlorothiazide (HYZAAR) 50-12.5 MG per tablet Take 1 tablet by mouth daily. 30 tablet 0    Psychiatric Specialty Exam:     Blood pressure 101/76, pulse 84, temperature 97.7 F (36.5 C), temperature source Oral, resp. rate 18, SpO2 100 %.There is no weight on file to calculate BMI.  General Appearance: Casual and Fairly Groomed  Eye Contact::  Good  Speech:  Clear and coherent  Volume:  Normal  Mood:  Euthymic  Affect:  Congruent  Thought Process:  Coherent, logical  Orientation:  Full (Time, Place, and Person)  Thought Content:  WDL  Suicidal Thoughts:  No  Homicidal Thoughts:  No  Memory: Good  Judgement: Fair  Insight:  Fair  Psychomotor Activity:  Normal  Concentration:  Good  Recall:  Good  Fund of Knowledge:Fair  Language: Good   Akathisia:  No  Handed:  Right  AIMS (if indicated):     Assets:  Communication Skills Housing Leisure Time Social Support  Sleep:      Musculoskeletal: Strength & Muscle Tone: within normal limits Gait & Station: normal Patient leans: N/A  Treatment Plan Summary: Discharge home with follow-up with his regular providers at [facility].  Waylan Boga  PMH-NP  07/08/2014 11:43 AM  Patient seen, evaluated and I agree with notes by Nurse Practitioner. Corena Pilgrim, MD

## 2014-09-14 ENCOUNTER — Emergency Department (HOSPITAL_COMMUNITY)
Admission: EM | Admit: 2014-09-14 | Discharge: 2014-09-16 | Disposition: A | Discharge status: Home / Self Care | Payer: Medicare Other | Attending: Emergency Medicine | Admitting: Emergency Medicine

## 2014-09-14 DIAGNOSIS — F319 Bipolar disorder, unspecified: Secondary | ICD-10-CM | POA: Insufficient documentation

## 2014-09-14 DIAGNOSIS — F200 Paranoid schizophrenia: Secondary | ICD-10-CM | POA: Insufficient documentation

## 2014-09-14 DIAGNOSIS — Z79899 Other long term (current) drug therapy: Secondary | ICD-10-CM | POA: Diagnosis not present

## 2014-09-14 DIAGNOSIS — F203 Undifferentiated schizophrenia: Secondary | ICD-10-CM | POA: Diagnosis present

## 2014-09-14 DIAGNOSIS — Z720 Tobacco use: Secondary | ICD-10-CM | POA: Insufficient documentation

## 2014-09-14 DIAGNOSIS — M6282 Rhabdomyolysis: Secondary | ICD-10-CM | POA: Diagnosis not present

## 2014-09-14 DIAGNOSIS — Z046 Encounter for general psychiatric examination, requested by authority: Secondary | ICD-10-CM | POA: Diagnosis present

## 2014-09-14 DIAGNOSIS — F209 Schizophrenia, unspecified: Secondary | ICD-10-CM | POA: Diagnosis present

## 2014-09-14 DIAGNOSIS — F29 Unspecified psychosis not due to a substance or known physiological condition: Secondary | ICD-10-CM | POA: Diagnosis not present

## 2014-09-14 LAB — CBC WITH DIFFERENTIAL/PLATELET
Basophils Absolute: 0 10*3/uL (ref 0.0–0.1)
Basophils Relative: 1 % (ref 0–1)
Eosinophils Absolute: 0 10*3/uL (ref 0.0–0.7)
Eosinophils Relative: 0 % (ref 0–5)
HEMATOCRIT: 35.2 % — AB (ref 39.0–52.0)
Hemoglobin: 11.9 g/dL — ABNORMAL LOW (ref 13.0–17.0)
LYMPHS ABS: 1.1 10*3/uL (ref 0.7–4.0)
Lymphocytes Relative: 19 % (ref 12–46)
MCH: 27.9 pg (ref 26.0–34.0)
MCHC: 33.8 g/dL (ref 30.0–36.0)
MCV: 82.6 fL (ref 78.0–100.0)
MONO ABS: 0.7 10*3/uL (ref 0.1–1.0)
Monocytes Relative: 11 % (ref 3–12)
NEUTROS ABS: 4.2 10*3/uL (ref 1.7–7.7)
Neutrophils Relative %: 69 % (ref 43–77)
Platelets: 207 10*3/uL (ref 150–400)
RBC: 4.26 MIL/uL (ref 4.22–5.81)
RDW: 12.6 % (ref 11.5–15.5)
WBC: 6.1 10*3/uL (ref 4.0–10.5)

## 2014-09-14 LAB — COMPREHENSIVE METABOLIC PANEL
ALT: 50 U/L (ref 0–53)
ANION GAP: 9 (ref 5–15)
AST: 116 U/L — ABNORMAL HIGH (ref 0–37)
Albumin: 4.2 g/dL (ref 3.5–5.2)
Alkaline Phosphatase: 62 U/L (ref 39–117)
BUN: 15 mg/dL (ref 6–23)
CHLORIDE: 104 mmol/L (ref 96–112)
CO2: 24 mmol/L (ref 19–32)
Calcium: 9.5 mg/dL (ref 8.4–10.5)
Creatinine, Ser: 1.11 mg/dL (ref 0.50–1.35)
GFR calc Af Amer: 90 mL/min — ABNORMAL LOW (ref >90)
GFR, EST NON AFRICAN AMERICAN: 77 mL/min — AB (ref >90)
Glucose, Bld: 118 mg/dL — ABNORMAL HIGH (ref 70–99)
Potassium: 3.4 mmol/L — ABNORMAL LOW (ref 3.5–5.1)
SODIUM: 137 mmol/L (ref 135–145)
TOTAL PROTEIN: 6.7 g/dL (ref 6.0–8.3)
Total Bilirubin: 0.8 mg/dL (ref 0.3–1.2)

## 2014-09-14 LAB — CK TOTAL AND CKMB (NOT AT ARMC)
CK TOTAL: 4113 U/L — AB (ref 7–232)
CK, MB: 10.7 ng/mL — AB (ref 0.3–4.0)
Relative Index: 0.3 (ref 0.0–2.5)

## 2014-09-14 LAB — CK
CK TOTAL: 5450 U/L — AB (ref 7–232)
CK TOTAL: 4652 U/L — AB (ref 7–232)

## 2014-09-14 LAB — RAPID URINE DRUG SCREEN, HOSP PERFORMED
Amphetamines: NOT DETECTED
BARBITURATES: NOT DETECTED
Benzodiazepines: NOT DETECTED
COCAINE: NOT DETECTED
OPIATES: NOT DETECTED
Tetrahydrocannabinol: NOT DETECTED

## 2014-09-14 LAB — ACETAMINOPHEN LEVEL: Acetaminophen (Tylenol), Serum: <10 ug/mL — ABNORMAL LOW (ref 10–30)

## 2014-09-14 LAB — SALICYLATE LEVEL

## 2014-09-14 LAB — ETHANOL: Alcohol, Ethyl (B): <5 mg/dL (ref 0–9)

## 2014-09-14 MED ORDER — BENZTROPINE MESYLATE 1 MG PO TABS
1.0000 mg | ORAL_TABLET | Freq: Two times a day (BID) | ORAL | Status: DC
  Administered 2014-09-14 – 2014-09-16 (×4): 1 mg via ORAL
  Filled 2014-09-14 (×4): qty 1

## 2014-09-14 MED ORDER — SODIUM CHLORIDE 0.9 % IV SOLN: 1000.0000 mL | Freq: Once | INTRAVENOUS | Status: DC

## 2014-09-14 MED ORDER — SODIUM CHLORIDE 0.9 % IV BOLUS (SEPSIS)
2000.0000 mL | Freq: Once | INTRAVENOUS | Status: AC
  Administered 2014-09-14: 2000 mL via INTRAVENOUS

## 2014-09-14 MED ORDER — DIPHENHYDRAMINE HCL 50 MG/ML IJ SOLN: 50.0000 mg | Freq: Once | INTRAMUSCULAR | Status: AC | PRN

## 2014-09-14 MED ORDER — LOSARTAN POTASSIUM-HCTZ 50-12.5 MG PO TABS: 1.0000 | ORAL_TABLET | Freq: Every day | ORAL | Status: DC

## 2014-09-14 MED ORDER — LORAZEPAM 2 MG/ML IJ SOLN
2.0000 mg | Freq: Once | INTRAMUSCULAR | Status: AC
Start: 2014-09-14 — End: 2014-09-14
  Administered 2014-09-14: 2 mg via INTRAVENOUS
  Filled 2014-09-14: qty 1

## 2014-09-14 MED ORDER — MIDAZOLAM HCL 2 MG/2ML IJ SOLN
5.0000 mg | Freq: Once | INTRAMUSCULAR | Status: DC
  Filled 2014-09-14: qty 6

## 2014-09-14 MED ORDER — HYDROCHLOROTHIAZIDE 12.5 MG PO CAPS
12.5000 mg | ORAL_CAPSULE | Freq: Every day | ORAL | Status: DC
  Administered 2014-09-15 – 2014-09-16 (×2): 12.5 mg via ORAL
  Filled 2014-09-14 (×2): qty 1

## 2014-09-14 MED ORDER — SODIUM CHLORIDE 0.9 % IV BOLUS (SEPSIS)
1000.0000 mL | Freq: Once | INTRAVENOUS | Status: AC
  Administered 2014-09-14: 1000 mL via INTRAVENOUS

## 2014-09-14 MED ORDER — POTASSIUM CHLORIDE CRYS ER 20 MEQ PO TBCR
40.0000 meq | EXTENDED_RELEASE_TABLET | Freq: Once | ORAL | Status: AC
  Administered 2014-09-14: 40 meq via ORAL
  Filled 2014-09-14: qty 2

## 2014-09-14 MED ORDER — SODIUM CHLORIDE 0.9 % IV SOLN: 1000.0000 mL | INTRAVENOUS | Status: DC

## 2014-09-14 MED ORDER — ZIPRASIDONE MESYLATE 20 MG IM SOLR: 10.0000 mg | Freq: Once | INTRAMUSCULAR | Status: AC | PRN

## 2014-09-14 MED ORDER — LOSARTAN POTASSIUM 50 MG PO TABS
50.0000 mg | ORAL_TABLET | Freq: Every day | ORAL | Status: DC
  Administered 2014-09-15 – 2014-09-16 (×2): 50 mg via ORAL
  Filled 2014-09-14 (×2): qty 1

## 2014-09-14 MED ORDER — ZIPRASIDONE MESYLATE 20 MG IM SOLR
10.0000 mg | Freq: Once | INTRAMUSCULAR | Status: AC
  Administered 2014-09-14: 10 mg via INTRAMUSCULAR
  Filled 2014-09-14: qty 20

## 2014-09-14 MED ORDER — RISPERIDONE 2 MG PO TABS
2.0000 mg | ORAL_TABLET | Freq: Two times a day (BID) | ORAL | Status: DC
  Administered 2014-09-14 – 2014-09-16 (×4): 2 mg via ORAL
  Filled 2014-09-14 (×4): qty 1

## 2014-09-14 MED ORDER — TRAZODONE HCL 50 MG PO TABS: 50.0000 mg | ORAL_TABLET | Freq: Every evening | ORAL | Status: DC | PRN

## 2014-09-14 NOTE — ED Notes (Signed)
Call from lab [facility] 10.7

## 2014-09-14 NOTE — ED Notes (Signed)
24 oz. Of water given to patient.

## 2014-09-14 NOTE — Consult Note (Signed)
[facility]   Reason for Consult:  Psychosis  Referring Physician:  EDP Patient Identification: Charles Mccoyhomas R Wolff Jr. MRN:  [id number] Principal Diagnosis: Schizophrenia, undifferentiated, acute episode Diagnosis:   Patient Active Problem List   Diagnosis Date Noted  . Schizophrenia, undifferentiated, acute episode [F20.3] 09/14/2014    Priority: High  . Psychosis [F29] 07/07/2014    Priority: High    Class: Acute    Total Time spent with patient: 45 minutes  Subjective:   Charles Mccoyhomas R Kelso Jr. is a 49 y.o. male patient admitted with psychosis.  HPI:  The patient presented to the ED after busting up his toilet with a hammer because he "wanted to see what was inside."  He told the ED nurses that he saw the [place] man in the toilet.  Auditory and visual hallucinations present, disorganized, increase in activity. Patient has been non compliant with his psychotropic medication. Denies suicidal/homicidal ideations and alcohol/drug abuse.  Patient was in four point restraints earlier and placed on IVC.  HPI Elements:    Location:  generalized. Quality:  acute. Severity:  severe. Timing:  constant. Duration:  few days. Context:  stressors, not taking his medications.  Past Medical History:  Past Medical History  Diagnosis Date  . Paranoid schizophrenia   . Bipolar affective disorder     Past Surgical History  Procedure Laterality Date  . Knee surgery    . Finger surgery     Family History: No family history on file. Social History:  History  Alcohol Use No     History  Drug Use No    History   Social History  . Marital Status: Single    Spouse Name: N/A  . Number of Children: N/A  . Years of Education: N/A   Social History Main Topics  . Smoking status: Current Every Day Smoker -- 0.50 packs/day  . Smokeless tobacco: Not on file  . Alcohol Use: No  . Drug Use: No  . Sexual Activity: Not on file   Other Topics Concern  . Not on file    Social History Narrative   Additional Social History:                          Allergies:   Allergies  Allergen Reactions  . Fish-Derived Products   . Arlice ColtMustard [Allyl Isothiocyanate]   . Other      ketchup, soy sauce, peanuts  . Peanut-Containing Drug Products   . Pork-Derived Products Swelling    Vitals: Blood pressure 141/63, pulse 81, temperature 97.7 F (36.5 C), temperature source Oral, resp. rate 18, SpO2 100 %.  Risk to Self: Is patient at risk for suicide?: Yes Risk to Others:   Prior Inpatient Therapy:   Prior Outpatient Therapy:    Current Facility-Administered Medications  Medication Dose Route Frequency Provider Last Rate Last Dose  . benztropine (COGENTIN) tablet 1 mg  1 mg Oral BID Nanine MeansJamison Lord, NP      . losartan-hydrochlorothiazide (HYZAAR) 50-12.5 MG per tablet 1 tablet  1 tablet Oral Daily Nanine MeansJamison Lord, NP      . midazolam (VERSED) injection 5 mg  5 mg Intramuscular Once Doug SouSam Jacubowitz, MD   Stopped at 09/14/14 0910  . risperiDONE (RISPERDAL) tablet 2 mg  2 mg Oral BID Nanine MeansJamison Lord, NP      . traZODone (DESYREL) tablet 50 mg  50 mg Oral QHS PRN Nanine MeansJamison Lord, NP  Current Outpatient Prescriptions  Medication Sig Dispense Refill  . benztropine (COGENTIN) 1 MG tablet Take 1 tablet (1 mg total) by mouth 2 (two) times daily. 30 tablet 0  . losartan-hydrochlorothiazide (HYZAAR) 50-12.5 MG per tablet Take 1 tablet by mouth daily. 30 tablet 0  . risperiDONE (RISPERDAL) 2 MG tablet Take 1 tablet (2 mg total) by mouth 2 (two) times daily. 60 tablet 0  . traZODone (DESYREL) 50 MG tablet Take 1 tablet (50 mg total) by mouth at bedtime as needed for sleep. 30 tablet 0    Musculoskeletal: Strength & Muscle Tone: within normal limits Gait & Station: normal Patient leans: N/A  Psychiatric Specialty Exam:     Blood pressure 141/63, pulse 81, temperature 97.7 F (36.5 C), temperature source Oral, resp. rate 18, SpO2 100 %.There is no weight on file to  calculate BMI.  General Appearance: Disheveled  Eye Solicitor::  Fair  Speech:  Pressured  Volume:  Increased  Mood:  Anxious and Irritable  Affect:  Blunt  Thought Process:  Disorganized and Tangential  Orientation:  Full (Time, Place, and Person)  Thought Content:  Hallucinations: Auditory Visual  Suicidal Thoughts:  No  Homicidal Thoughts:  No  Memory:  Immediate;   Fair Recent;   Fair Remote;   Fair  Judgement:  Impaired  Insight:  Lacking  Psychomotor Activity:  Increased  Concentration:  Fair  Recall:  [employer] of Knowledge:Fair  Language: Good  Akathisia:  No  Handed:  Right  AIMS (if indicated):     Assets:  Housing Leisure Time Physical Health Resilience Social Support  ADL's:  Intact  Cognition: WNL  Sleep:      Medical Decision Making: Review of Psycho-Social Stressors (1), Review or order clinical lab tests (1) and Review of Medication Regimen & Side Effects (2)  Treatment Plan Summary: Daily contact with patient to assess and evaluate symptoms and progress in treatment, Medication management and Plan admit to inpatient psychiatry for stabilization  Plan:  Recommend psychiatric Inpatient admission when medically cleared.  Disposition: Admit to inpatient psych when bed available  Nanine Means, PMH-NP 09/14/2014 3:52 PM  Patient seen face to face for this psych evaluation and case discussed with treatment team, staff RN and physician extender and formulated treatment plan. Reviewed the information documented and agree with the treatment plan.  Nickole Adamek,JANARDHAHA R. 09/15/2014 12:00 PM

## 2014-09-14 NOTE — ED Notes (Signed)
Report received from Janie Rambo RN. Pt. Sleeping, respirations regular and unlabored. Will continue to monitor for safety via security cameras and Q 15 minute checks. 

## 2014-09-14 NOTE — ED Notes (Signed)
MD at bedside. 

## 2014-09-14 NOTE — ED Notes (Signed)
Pt up to the bathroom on arrival, currently sleeping in room

## 2014-09-14 NOTE — ED Notes (Signed)
Ambulatory w/o difficulty to room 38

## 2014-09-14 NOTE — ED Notes (Signed)
MD at bedside. EDP J BACK TO REEVALUATE THIS PT

## 2014-09-14 NOTE — ED Notes (Signed)
Bed: WA17 Expected date:  Expected time:  Means of arrival:  Comments: hold 

## 2014-09-14 NOTE — ED Provider Notes (Signed)
CK continues to be elevated.  Will reccheck cr in the am.  Continue with hydration.  Linwood DibblesJon Selda Jalbert, MD 09/14/14 [phone]

## 2014-09-14 NOTE — ED Notes (Signed)
Pt had loud outburst from tech trying to get updated vitals.

## 2014-09-14 NOTE — ED Notes (Signed)
[facility]ison [facility] and they will continue to encourage fluids. Spoke with Barrister's clerkJanie RN and gave report, pt to go to room 38. Pt ambulated to bathroom without assistance, IV removed and VS updated.

## 2014-09-14 NOTE — ED Notes (Addendum)
EDP at bedside  

## 2014-09-14 NOTE — ED Notes (Signed)
MD at bedside. EDP J PRESENT TO EVALUATE THIS PT 

## 2014-09-14 NOTE — ED Provider Notes (Signed)
CSN: [id number]     Arrival date & time 09/14/14  0815 History   First MD Initiated Contact with Patient 09/14/14 0825     No chief complaint on file.  Level V caveat patient combative and uncooperative  (Consider location/radiation/quality/duration/timing/severity/associated sxs/prior Treatment) HPI Patient brought here by police after involuntary commitment papers followed by family member. Involuntary commitment papers reported that patient has been diagnosed with bipolar disorder and schizophrenia and has not been taking his medications he has become reclusive and has destroyed his apartment and has become hostile towards his sister and a danger to himself and others. Presently patient is yelling will not answer questions combative on my arrival to room patient in 4 point restraints Past Medical History  Diagnosis Date  . Paranoid schizophrenia   . Bipolar affective disorder    Past Surgical History  Procedure Laterality Date  . Knee surgery    . Finger surgery     No family history on file. History  Substance Use Topics  . Smoking status: Current Every Day Smoker -- 0.50 packs/day  . Smokeless tobacco: Not on file  . Alcohol Use: No    Review of Systems  Unable to perform ROS  uncooperative combative    Allergies  Fish-derived products; Mustard; Other; Peanut-containing drug products; and Pork-derived products  Home Medications   Prior to Admission medications   Medication Sig Start Date End Date Taking? Authorizing Provider  benztropine (COGENTIN) 1 MG tablet Take 1 tablet (1 mg total) by mouth 2 (two) times daily. 03/31/14   Shuvon Rankin, NP  losartan-hydrochlorothiazide (HYZAAR) 50-12.5 MG per tablet Take 1 tablet by mouth daily. 12/04/13   Kaitlyn Szekalski, PA-C  risperiDONE (RISPERDAL) 2 MG tablet Take 1 tablet (2 mg total) by mouth 2 (two) times daily. 03/31/14   Shuvon Rankin, NP  traZODone (DESYREL) 50 MG tablet Take 1 tablet (50 mg total) by mouth at bedtime  as needed for sleep. 03/31/14   Shuvon Rankin, NP   BP 119/61 mmHg  Pulse 93  Temp(Src) 97.6 F (36.4 C) (Oral)  Resp 18  SpO2 99% Physical Exam  Constitutional: He appears well-developed and well-nourished. He appears distressed.  Yelling curse words trying to get out of restraints  HENT:  Head: Normocephalic and atraumatic.  Eyes: Conjunctivae are normal. Pupils are equal, round, and reactive to light.  Neck: Neck supple. No tracheal deviation present. No thyromegaly present.  Cardiovascular: Normal rate and regular rhythm.   No murmur heard. Pulmonary/Chest: Effort normal and breath sounds normal.  Abdominal: Soft. Bowel sounds are normal. He exhibits no distension. There is no tenderness.  Musculoskeletal: Normal range of motion. He exhibits no edema or tenderness.  Neurological: He is alert.  Moves all extremities motor strength 5 over 5 overall  Skin: Skin is warm and dry. No rash noted.  Psychiatric: He has a normal mood and affect.  Nursing note and vitals reviewed.   ED Course  Procedures (including critical care time) Labs Review Labs Reviewed  CBC WITH DIFFERENTIAL/PLATELET  COMPREHENSIVE METABOLIC PANEL  URINE RAPID DRUG SCREEN (HOSP PERFORMED)  ETHANOL  CK    Imaging Review No results found.   EKG Interpretation None     Patient medicated with Versed IM and Geodon IM, as he was combative. At 9 AM patient is sleeping arousable to gentle tactile stimulus states "I'm all right" he appears more calm and cooperative  12:05 PM patient is alert Glasgow Coma Score 15 he is now out of restraints and cooperative.  He ambulates without difficulty Results for orders placed or performed during the hospital encounter of 09/14/14  CBC WITH DIFFERENTIAL  Result Value Ref Range   WBC 6.1 4.0 - 10.5 K/uL   RBC 4.26 4.22 - 5.81 MIL/uL   Hemoglobin 11.9 (L) 13.0 - 17.0 g/dL   HCT 40.935.2 (L) 81.139.0 - 91.452.0 %   MCV 82.6 78.0 - 100.0 fL   MCH 27.9 26.0 - 34.0 pg   MCHC 33.8  30.0 - 36.0 g/dL   RDW 78.212.6 95.611.5 - 21.315.5 %   Platelets 207 150 - 400 K/uL   Neutrophils Relative % 69 43 - 77 %   Neutro Abs 4.2 1.7 - 7.7 K/uL   Lymphocytes Relative 19 12 - 46 %   Lymphs Abs 1.1 0.7 - 4.0 K/uL   Monocytes Relative 11 3 - 12 %   Monocytes Absolute 0.7 0.1 - 1.0 K/uL   Eosinophils Relative 0 0 - 5 %   Eosinophils Absolute 0.0 0.0 - 0.7 K/uL   Basophils Relative 1 0 - 1 %   Basophils Absolute 0.0 0.0 - 0.1 K/uL  Comprehensive metabolic panel  Result Value Ref Range   Sodium 137 135 - 145 mmol/L   Potassium 3.4 (L) 3.5 - 5.1 mmol/L   Chloride 104 96 - 112 mmol/L   CO2 24 19 - 32 mmol/L   Glucose, Bld 118 (H) 70 - 99 mg/dL   BUN 15 6 - 23 mg/dL   Creatinine, Ser 0.861.11 0.50 - 1.35 mg/dL   Calcium 9.5 8.4 - 57.810.5 mg/dL   Total Protein 6.7 6.0 - 8.3 g/dL   Albumin 4.2 3.5 - 5.2 g/dL   AST 469116 (H) 0 - 37 U/L   ALT 50 0 - 53 U/L   Alkaline Phosphatase 62 39 - 117 U/L   Total Bilirubin 0.8 0.3 - 1.2 mg/dL   GFR calc non Af Amer 77 (L) >90 mL/min   GFR calc Af Amer 90 (L) >90 mL/min   Anion gap 9 5 - 15  Drug screen panel, emergency  Result Value Ref Range   Opiates NONE DETECTED NONE DETECTED   Cocaine NONE DETECTED NONE DETECTED   Benzodiazepines NONE DETECTED NONE DETECTED   Amphetamines NONE DETECTED NONE DETECTED   Tetrahydrocannabinol NONE DETECTED NONE DETECTED   Barbiturates NONE DETECTED NONE DETECTED  Ethanol  Result Value Ref Range   Alcohol, Ethyl (B) <5 0 - 9 mg/dL  Acetaminophen level  Result Value Ref Range   Acetaminophen (Tylenol), Serum <10.0 (L) 10 - 30 ug/mL  Salicylate level  Result Value Ref Range   Salicylate Lvl <4.0 2.8 - 20.0 mg/dL   No results found. 3:30 PM patient remains calm, cooperative.  At 4:30 PM patient is agitated, verbally abusive. Ativan IV ordered.  45 pm pt resting comfortably after treatment with Ativan. Appears calm, talkative, cooperative MDM  Patient exhibiting mild rhabdomyolysis. With CK of 5450. IV fluids  ordered to treat rhabdomyolysis. I[facility] spoken with [facility] staff [facility]. They will accept him once he is more calm. Pt signed out to Dr. Roselyn BeringJ. Knapp at 445 pm Final diagnoses:  None   Diagnoses #1 acute psychosis #2 mild rhabdomyolysis #663medication non compliance #4 hypokalemia CRITICAL CARE Performed by: Doug SouJACUBOWITZ,Tarius Stangelo Total critical care time: 45 minutes Critical care time was exclusive of separately billable procedures and treating other patients. Critical care was necessary to treat or prevent imminent or life-threatening deterioration. Critical care was time spent personally by me on the following  activities: development of treatment plan with patient and/or surrogate as well as nursing, discussions with consultants, evaluation of patient's response to treatment, examination of patient, obtaining history from patient or surrogate, ordering and performing treatments and interventions, ordering and review of laboratory studies, ordering and review of radiographic studies, pulse oximetry and re-evaluation of patient's condition.      Doug Sou, MD 09/14/14 1650

## 2014-09-15 DIAGNOSIS — F29 Unspecified psychosis not due to a substance or known physiological condition: Secondary | ICD-10-CM | POA: Diagnosis not present

## 2014-09-15 DIAGNOSIS — F203 Undifferentiated schizophrenia: Secondary | ICD-10-CM | POA: Diagnosis not present

## 2014-09-15 LAB — BASIC METABOLIC PANEL
ANION GAP: 5 (ref 5–15)
BUN: 9 mg/dL (ref 6–23)
CHLORIDE: 107 mmol/L (ref 96–112)
CO2: 26 mmol/L (ref 19–32)
CREATININE: 1.02 mg/dL (ref 0.50–1.35)
Calcium: 8.9 mg/dL (ref 8.4–10.5)
GFR calc non Af Amer: 86 mL/min — ABNORMAL LOW (ref >90)
Glucose, Bld: 96 mg/dL (ref 70–99)
Potassium: 3.7 mmol/L (ref 3.5–5.1)
SODIUM: 138 mmol/L (ref 135–145)

## 2014-09-15 LAB — CK: CK TOTAL: 2812 U/L — AB (ref 7–232)

## 2014-09-15 NOTE — ED Notes (Addendum)
Pt became argumentative and loud w/ his sister Elonda Husky(Cassandra) when she left.  Sister reports that she is going tomorrow to obtain [employer] papers and that she and her family feel that he needs to be placed in another facility to get stablized on his medications before he is dc'd.  She reports that he is being evicted from his apt because of the damage that he has caused.  She reports that he destroyed the toilet and has been urinating and defecating in the bathtub, has given away most of his clothes,and  there is food on the floor.  She reports that she is trying to get him placed in a group home after he is dc'd.

## 2014-09-15 NOTE — ED Notes (Addendum)
Dr Ignacia BayleyJ, Jamison and TTS into see

## 2014-09-15 NOTE — ED Notes (Signed)
CSW in w/ pt and sister

## 2014-09-15 NOTE — ED Notes (Signed)
Sisters contact numbers:  Hilma Favorshomasema Mcdonagh [phone]; Elonda HuskyCassandra Kozicki-Johnson [phone]

## 2014-09-15 NOTE — ED Notes (Signed)
Up on the phone 

## 2014-09-15 NOTE — ED Notes (Signed)
Up to the bathroom 

## 2014-09-15 NOTE — ED Notes (Signed)
Report received from Janie Rambo RN. Pt. Alert and oriented in no distress denies SI, HI, AVH and pain.  Pt. Instructed to come to me with problems or concerns.Will continue to monitor for safety via security cameras and Q 15 minute checks. 

## 2014-09-15 NOTE — BH Assessment (Signed)
Assessment Note  Charles Cortez. is an 49 y.o. male. Patient was brought into the ED by [employer] because of busting up his toilet with a hammer because he "wanted to see what was inside." He told the ED nurses that he saw the [place] man in the toilet. Auditory and visual hallucinations present, disorganized, increase in activity. Denies suicidal/homicidal ideations and alcohol/drug abuse. Patient was in four point restraints yesterday.  Patient wrote a note to the doctor requesting to be released to allow him to go home to take care of his apartment.    Patient's sister is attempting to get guardianship on the next business day.  The sister spoke with the ED social worker to inform her that they are wanting the patient to go into to a family care home.    CSW consulted with Dr. Elsie Saas and Catha Nottingham, NP it is recommended for the patient to be stabilized on his medications.    Axis I: Chronic Paranoid Schizophrenia Axis II: Deferred Axis III:  Past Medical History  Diagnosis Date  . Paranoid schizophrenia   . Bipolar affective disorder    Axis IV: housing problems, other psychosocial or environmental problems, problems related to social environment, problems with access to health care services and problems with primary support group Axis V: 21-30 behavior considerably influenced by delusions or hallucinations OR serious impairment in judgment, communication OR inability to function in almost all areas  Past Medical History:  Past Medical History  Diagnosis Date  . Paranoid schizophrenia   . Bipolar affective disorder     Past Surgical History  Procedure Laterality Date  . Knee surgery    . Finger surgery      Family History: No family history on file.  Social History:  reports that he has been smoking.  He does not have any smokeless tobacco history on file. He reports that he does not drink alcohol or use illicit drugs.  Additional Social History:     CIWA:  CIWA-Ar BP: 133/68 mmHg Pulse Rate: 95 COWS:    Allergies:  Allergies  Allergen Reactions  . Fish-Derived Products   . Arlice Colt Isothiocyanate]   . Other      ketchup, soy sauce, peanuts  . Peanut-Containing Drug Products   . Pork-Derived Products Swelling    Home Medications:  (Not in a hospital admission)  OB/GYN Status:  No LMP for male patient.  General Assessment Data Location of Assessment: [facility] ACT Assessment: Yes Is this a Tele or Face-to-Face Assessment?: Face-to-Face Is this an Initial Assessment or a Re-assessment for this encounter?: Initial Assessment Living Arrangements: Alone Can pt return to current living arrangement?: Yes Admission Status: Involuntary Is patient capable of signing voluntary admission?: No Transfer from: Home Referral Source: Self/Family/Friend  Medical Screening Exam [facility] Walk-in ONLY) Medical Exam completed: Yes  [facility] Crisis Care Plan Living Arrangements: Alone Name of Psychiatrist: [facility]  Education Status Is patient currently in school?: No  Risk to self with the past 6 months Suicidal Ideation: No-Not Currently/Within Last 6 Months Suicidal Intent: No-Not Currently/Within Last 6 Months Is patient at risk for suicide?: No Suicidal Plan?: No-Not Currently/Within Last 6 Months Access to Means: No What has been your use of drugs/alcohol within the last 12 months?: none Previous Attempts/Gestures: No Intentional Self Injurious Behavior: None Family Suicide History: No Recent stressful life event(s): Other (Comment) (none compliant with medications) Persecutory voices/beliefs?: No Depression: No Substance abuse history and/or treatment for substance abuse?: No  Risk to  Others within the past 6 months Homicidal Ideation: No Thoughts of Harm to Others: No Current Homicidal Intent: No Current Homicidal Plan: No Access to Homicidal Means: No History of harm to others?: No Assessment of Violence: None Noted Does  patient have access to weapons?: No Criminal Charges Pending?: No Does patient have a court date: No  Psychosis Hallucinations: Visual Delusions: Unspecified  Mental Status Report Appear/Hygiene: In hospital gown Eye Contact: Fair Motor Activity: Hyperactivity Speech: Tangential, Pressured Level of Consciousness: Irritable, Alert Mood: Irritable, Labile Affect: Labile Anxiety Level: Moderate Thought Processes: Tangential Judgement: Impaired Orientation: Person, Place Obsessive Compulsive Thoughts/Behaviors: None  Cognitive Functioning Concentration: Poor Memory: Remote Intact, Recent Intact IQ: Average Insight: Poor Impulse Control: Poor Appetite: Fair Sleep: No Change  ADLScreening [facility]) Patient's cognitive ability adequate to safely complete daily activities?: Yes Patient able to express need for assistance with ADLs?: Yes Independently performs ADLs?: Yes (appropriate for developmental age)  Prior Inpatient Therapy Prior Inpatient Therapy: Yes Prior Therapy Dates: unknown [facility] Reason for Treatment: Psychosis  Prior Outpatient Therapy Prior Outpatient Therapy: Yes Prior Therapy Dates: current Prior Therapy [facility] Reason for Treatment: schizophrenia  ADL Screening (condition at time of admission) Patient's cognitive ability adequate to safely complete daily activities?: Yes Patient able to express need for assistance with ADLs?: Yes Independently performs ADLs?: Yes (appropriate for developmental age)                  Additional Information 1:1 In Past 12 Months?: No CIRT Risk: No Elopement Risk: No Does patient have medical clearance?: Yes     Disposition:  Disposition Initial Assessment Completed for this Encounter: Yes Disposition of Patient: Inpatient treatment program Type of inpatient treatment program: Adult  On Site Evaluation by:   Reviewed with Physician:     Maryelizabeth Rowanorbett, Ryonna Cimini A 09/15/2014 11:06 AM

## 2014-09-15 NOTE — Progress Notes (Signed)
1:47pm. CSW met with pt's sister-, Charles Cortez who lives in [place], to discuss d/c planning. Charles Cortez had interim guardianship papers and expressed her intent to go to courthouse tomorrow to file the paperwork. Charles Cortez also advocated for pt to receive inpatient treatment, as he has had two ED admits (September '15 and December '15) without further treatment. CSW stated that patient is re-evaluated every day for medication management and disposition. Pt sees [facility] for med management. Does not have an ACT team. Some sort of community tx has apparently tried to engage this past fall with pt but pt has refused to engage. Charles Cortez also had a list of group homes and she expressed intent to find placement for pt once she had guardianship. Charles Cortez asked questions about group home placement process which CSW answered. Charles Cortez stated that pt's other sister, who would be joint guardian, would be visiting later that day.   CSW later met with pt's other sister, Charles Cortez, who lives in [place]. CSW reviewed ED procedures, and outlined guardianship and group home placement process from community.   CSW met with Charles Cortez and pt together. Pt became irritable when topic of inpatient treatment and group home placement came up. Pt stated that he wanted to be d/c. When CSW asked what she could help pt, he stated that he was working on getting his birth certificate from [place] and getting section 8. Pt became irritable Charles Cortez asked about what had happened in his current apartment and where he would go from hospital. Pt signed a consent form for his sisters. Pt also included his child's mother, Charles Cortez, on consent form. Pt and sister got into argument over this. Sister informed CSW later that pt and sister have no contact orders out against each other in [place] and that she is a trigger for him. CSW passed this information along to staff.   Charles Cortez,     ED CSW  phone: [phone]

## 2014-09-15 NOTE — ED Notes (Signed)
Pt's sister in talking w/ csw

## 2014-09-15 NOTE — ED Notes (Signed)
Family requesting that he be checked for tape worms/worms to rule out any possibility.

## 2014-09-15 NOTE — BH Assessment (Signed)
This Clinical research associatewriter recognized that there was not a TTS consult put in the system for the evaluation of this patient when he came in the ED.  A consult was completed as soon as the error was recognized.     Maryelizabeth Rowanressa Kylar Speelman, MSW, LCSW, LCAS-A Evening Clinical Social Worker [phone]

## 2014-09-15 NOTE — Consult Note (Signed)
[facility] Face-to-Face Psychiatry Consult   Reason for Consult:  Psychosis  Referring Physician:  EDP Patient Identification: Charles Cortez. MRN:  [id number] Principal Diagnosis: Schizophrenia, undifferentiated, acute episode Diagnosis:   Patient Active Problem List   Diagnosis Date Noted  . Schizophrenia, undifferentiated, acute episode [F20.3] 09/14/2014    Priority: High  . Psychosis [F29] 07/07/2014    Priority: High    Class: Acute    Total Time spent with patient: 45 minutes  Subjective:   Charles Cortez. is a 49 y.o. male patient admitted with psychosis.  HPI:  The patient has improved today but continues to ramble with disorganization at times and some irritability.  He also has increase energy and pressured speech.  Rahim wants to leave but not stable at this time.  His sisters are here and going to get interim guardianship tomorrow.  They would like him to go inpatient for stabilization and then to a group home.    HPI Elements:    Location:  generalized. Quality:  acute. Severity:  severe. Timing:  constant. Duration:  few days. Context:  stressors, not taking his medications.  Past Medical History:  Past Medical History  Diagnosis Date  . Paranoid schizophrenia   . Bipolar affective disorder     Past Surgical History  Procedure Laterality Date  . Knee surgery    . Finger surgery     Family History: No family history on file. Social History:  History  Alcohol Use No     History  Drug Use No    History   Social History  . Marital Status: Single    Spouse Name: N/A  . Number of Children: N/A  . Years of Education: N/A   Social History Main Topics  . Smoking status: Current Every Day Smoker -- 0.50 packs/day  . Smokeless tobacco: Not on file  . Alcohol Use: No  . Drug Use: No  . Sexual Activity: Not on file   Other Topics Concern  . Not on file   Social History Narrative   Additional Social History:                          Allergies:   Allergies  Allergen Reactions  . Fish-Derived Products   . Arlice Colt Isothiocyanate]   . Other      ketchup, soy sauce, peanuts  . Peanut-Containing Drug Products   . Pork-Derived Products Swelling    Vitals: Blood pressure 135/88, pulse 117, temperature 97.9 F (36.6 C), temperature source Oral, resp. rate 17, SpO2 98 %.  Risk to Self: Suicidal Ideation: No-Not Currently/Within Last 6 Months Suicidal Intent: No-Not Currently/Within Last 6 Months Is patient at risk for suicide?: No Suicidal Plan?: No-Not Currently/Within Last 6 Months Access to Means: No What has been your use of drugs/alcohol within the last 12 months?: none Intentional Self Injurious Behavior: None Risk to Others: Homicidal Ideation: No Thoughts of Harm to Others: No Current Homicidal Intent: No Current Homicidal Plan: No Access to Homicidal Means: No History of harm to others?: No Assessment of Violence: None Noted Does patient have access to weapons?: No Criminal Charges Pending?: No Does patient have a court date: No Prior Inpatient Therapy: Prior Inpatient Therapy: Yes Prior Therapy Dates: unknown Prior Therapy Facilty/Provider(s): [facility] Reason for Treatment: Psychosis Prior Outpatient Therapy: Prior Outpatient Therapy: Yes Prior Therapy Dates: current Prior Therapy Facilty/Provider([facility]): [facility] Reason for Treatment: schizophrenia  Current Facility-Administered Medications  Medication Dose  Route Frequency Provider Last Rate Last Dose  . 0.9 %  sodium chloride infusion  1,000 mL Intravenous Once Linwood DibblesJon Knapp, MD       Followed by  . 0.9 %  sodium chloride infusion  1,000 mL Intravenous Continuous Linwood DibblesJon Knapp, MD      . benztropine (COGENTIN) tablet 1 mg  1 mg Oral BID Nanine MeansJamison Lord, NP   1 mg at 09/15/14 1043  . losartan (COZAAR) tablet 50 mg  50 mg Oral Daily Doug SouSam Jacubowitz, MD   50 mg at 09/15/14 1042   And  . hydrochlorothiazide (MICROZIDE) capsule 12.5 mg  12.5 mg Oral Daily Doug SouSam  Jacubowitz, MD   12.5 mg at 09/15/14 1043  . midazolam (VERSED) injection 5 mg  5 mg Intramuscular Once Doug SouSam Jacubowitz, MD   Stopped at 09/14/14 0910  . risperiDONE (RISPERDAL) tablet 2 mg  2 mg Oral BID Nanine MeansJamison Lord, NP   2 mg at 09/15/14 1043  . traZODone (DESYREL) tablet 50 mg  50 mg Oral QHS PRN Nanine MeansJamison Lord, NP       Current Outpatient Prescriptions  Medication Sig Dispense Refill  . benztropine (COGENTIN) 1 MG tablet Take 1 tablet (1 mg total) by mouth 2 (two) times daily. 30 tablet 0  . losartan-hydrochlorothiazide (HYZAAR) 50-12.5 MG per tablet Take 1 tablet by mouth daily. 30 tablet 0  . risperiDONE (RISPERDAL) 2 MG tablet Take 1 tablet (2 mg total) by mouth 2 (two) times daily. 60 tablet 0  . traZODone (DESYREL) 50 MG tablet Take 1 tablet (50 mg total) by mouth at bedtime as needed for sleep. 30 tablet 0    Musculoskeletal: Strength & Muscle Tone: within normal limits Gait & Station: normal Patient leans: N/A  Psychiatric Specialty Exam:     Blood pressure 135/88, pulse 117, temperature 97.9 F (36.6 C), temperature source Oral, resp. rate 17, SpO2 98 %.There is no weight on file to calculate BMI.  General Appearance: Disheveled  Eye SolicitorContact::  Fair  Speech:  Pressured  Volume:  Increased  Mood:  Anxious and Irritable  Affect:  Blunt  Thought Process:  Disorganized and Tangential  Orientation:  Full (Time, Place, and Person)  Thought Content:  Hallucinations: Auditory Visual  Suicidal Thoughts:  No  Homicidal Thoughts:  No  Memory:  Immediate;   Fair Recent;   Fair Remote;   Fair  Judgement:  Impaired  Insight:  Lacking  Psychomotor Activity:  Increased  Concentration:  Fair  Recall:  FiservFair  Fund of Knowledge:Fair  Language: Good  Akathisia:  No  Handed:  Right  AIMS (if indicated):     Assets:  Housing Leisure Time Physical Health Resilience Social Support  ADL's:  Intact  Cognition: WNL  Sleep:      Medical Decision Making: Review of Psycho-Social  Stressors (1), Review or order clinical lab tests (1) and Review of Medication Regimen & Side Effects (2)  Treatment Plan Summary: Daily contact with patient to assess and evaluate symptoms and progress in treatment, Medication management and Plan admit to inpatient psychiatry for stabilization  Plan:  Recommend psychiatric Inpatient admission when medically cleared.  Disposition: Admit to inpatient psych when bed available  Nanine MeansLORD, JAMISON, PMH-NP 09/15/2014 3:54 PM  Patient seen face to face for this psych evaluation and case discussed with treatment team, staff RN and physician extender and formulated treatment plan. Reviewed the information documented and agree with the treatment plan.   Katti Pelle,JANARDHAHA R. 09/16/2014 11:02 AM

## 2014-09-16 ENCOUNTER — Encounter (HOSPITAL_COMMUNITY): Payer: Self-pay | Admitting: *Deleted

## 2014-09-16 ENCOUNTER — Inpatient Hospital Stay (HOSPITAL_COMMUNITY)
Admission: AD | Admit: 2014-09-16 | Discharge: 2014-09-23 | DRG: 885 | Disposition: A | Discharge status: Nursing Home (Includes ICF) | Payer: Medicare Other | Attending: Psychiatry | Admitting: Psychiatry

## 2014-09-16 DIAGNOSIS — F209 Schizophrenia, unspecified: Secondary | ICD-10-CM | POA: Diagnosis not present

## 2014-09-16 DIAGNOSIS — F17210 Nicotine dependence, cigarettes, uncomplicated: Secondary | ICD-10-CM | POA: Diagnosis present

## 2014-09-16 DIAGNOSIS — R748 Abnormal levels of other serum enzymes: Secondary | ICD-10-CM | POA: Diagnosis present

## 2014-09-16 DIAGNOSIS — F205 Residual schizophrenia: Secondary | ICD-10-CM | POA: Diagnosis not present

## 2014-09-16 DIAGNOSIS — Z9114 Patient's other noncompliance with medication regimen: Secondary | ICD-10-CM | POA: Diagnosis not present

## 2014-09-16 DIAGNOSIS — F29 Unspecified psychosis not due to a substance or known physiological condition: Secondary | ICD-10-CM | POA: Diagnosis not present

## 2014-09-16 DIAGNOSIS — F200 Paranoid schizophrenia: Secondary | ICD-10-CM | POA: Diagnosis not present

## 2014-09-16 DIAGNOSIS — I10 Essential (primary) hypertension: Secondary | ICD-10-CM | POA: Diagnosis present

## 2014-09-16 DIAGNOSIS — F203 Undifferentiated schizophrenia: Secondary | ICD-10-CM | POA: Diagnosis not present

## 2014-09-16 MED ORDER — ALUM & MAG HYDROXIDE-SIMETH 200-200-20 MG/5ML PO SUSP: 30.0000 mL | ORAL | Status: DC | PRN

## 2014-09-16 MED ORDER — LOSARTAN POTASSIUM 50 MG PO TABS
50.0000 mg | ORAL_TABLET | Freq: Every day | ORAL | Status: DC
  Administered 2014-09-17 – 2014-09-23 (×7): 50 mg via ORAL
  Filled 2014-09-16 (×9): qty 1

## 2014-09-16 MED ORDER — BENZTROPINE MESYLATE 1 MG PO TABS
1.0000 mg | ORAL_TABLET | Freq: Two times a day (BID) | ORAL | Status: DC
  Administered 2014-09-16 – 2014-09-23 (×14): 1 mg via ORAL
  Filled 2014-09-16 (×13): qty 1
  Filled 2014-09-16: qty 6
  Filled 2014-09-16 (×2): qty 1
  Filled 2014-09-16: qty 6
  Filled 2014-09-16 (×3): qty 1

## 2014-09-16 MED ORDER — HYDROCHLOROTHIAZIDE 12.5 MG PO CAPS
12.5000 mg | ORAL_CAPSULE | Freq: Every day | ORAL | Status: DC
  Administered 2014-09-17 – 2014-09-23 (×7): 12.5 mg via ORAL
  Filled 2014-09-16 (×10): qty 1

## 2014-09-16 MED ORDER — TRAZODONE HCL 50 MG PO TABS: 50.0000 mg | ORAL_TABLET | Freq: Every evening | ORAL | Status: DC | PRN

## 2014-09-16 MED ORDER — LORAZEPAM 1 MG PO TABS: 1.0000 mg | ORAL_TABLET | Freq: Four times a day (QID) | ORAL | Status: DC | PRN

## 2014-09-16 MED ORDER — RISPERIDONE 2 MG PO TABS
2.0000 mg | ORAL_TABLET | Freq: Two times a day (BID) | ORAL | Status: DC
  Administered 2014-09-16 – 2014-09-18 (×4): 2 mg via ORAL
  Filled 2014-09-16 (×8): qty 1

## 2014-09-16 MED ORDER — MAGNESIUM HYDROXIDE 400 MG/5ML PO SUSP: 30.0000 mL | Freq: Every day | ORAL | Status: DC | PRN

## 2014-09-16 NOTE — Progress Notes (Signed)
Adult Psychoeducational Group Note  Date:  09/16/2014 Time:  9:11 PM  Group Topic/Focus:  Wrap-Up Group:   The focus of this group is to help patients review their daily goal of treatment and discuss progress on daily workbooks.  Participation Level:  Did Not Attend  Participation Quality:  pt did not attend  Affect:  pt did not attend  Cognitive:  pt did not attend  Insight: None  Engagement in Group:  pt did not attend  Modes of Intervention:  pt did not attend  Additional Comments:  Pt did not attend  Lakechia Nay A 09/16/2014, 9:11 PM

## 2014-09-16 NOTE — Progress Notes (Signed)
CSW spoke with pt sister, Hilma Favorshomasema Richner, per sister, family has a court date 10/08/2014 for interim guardianship and would like patient to go to inpatient for stabilization followed by a group home placement. Patient sister ifnormed patietn that he has been evicted and they would like to help. Pt states to this writer that he no longer wants patient sisters to have consent to release information and wants the entire form destroyed. At this time staff is unable to release any information regarding patient care and treatment plan. CSW attempted to discuss with patient that patietn family is only trying to help as patient has no where to live at this time. Patient stated he could manage all. CSW discussed with psychiatrist. At this time per psychiatrist patient still has capacity to make his own decisions while pending interim guardianship hearing.  Byrd HesselbachKristen Anikka Marsan, LCSW [phone]  ED CSW 09/16/2014 11:50 AM

## 2014-09-16 NOTE — [facility] Counselor (Addendum)
Charles Cortez, [facility], confirms adult unit is currently at capacity. Contacted the following facilities for placement:  INFORMATION FAXED, PT UNDER REVIEW: [facility]  AT CAPACITY: [facility], per Claudie LeachLinda [facility], per [facility], per [facility], per [facility], per [facility], per [facility], per [facility], per [facility], per [facility], per [facility], per [facility], per [facility], per [facility], per Artist PaisShawna Alvia GroveB[facility], per [facility], per [facility], per [facility], per Ivin BootyJoshua  NO RESPONSE: [facility]   [address]Sheria Rosello Ellis Patsy BaltimoreWarrick Jr, WisconsinLPC, [facility] LPNCC Triage Specialist [phone]

## 2014-09-16 NOTE — Consult Note (Signed)
[facility] Face-to-Face Psychiatry Consult   Reason for Consult:  Psychosis  Referring Physician:  EDP Patient Identification: Charles Cortez. MRN:  [id number] Principal Diagnosis: Schizophrenia, undifferentiated, acute episode Diagnosis:   Patient Active Problem List   Diagnosis Date Noted  . Schizophrenia, undifferentiated, acute episode [F20.3] 09/14/2014  . Psychosis [F29] 07/07/2014    Class: Acute    Total Time spent with patient: 45 minutes  Subjective:   Charles Cortez. is a 49 y.o. male patient admitted with psychosis.  HPI:  The patient has improved today but continues to ramble with disorganization at times and some irritability.  He also has increase energy and pressured speech.  Clance wants to leave but not stable at this time.  His sisters are here and going to get interim guardianship tomorrow.  They would like him to go inpatient for stabilization and then to a group home.   Reviewed above note with updates.  Patient remains disorganized this morning and contentiously writing notes to the providers asking to be discharged home.  He is constantly coming to the nursing station or provider door to speak to somebody.  He is accepted for admission and has abed assigned.  Patient will be transferred as soon as transportation is available.  HPI Elements:    Location:  generalized. Quality:  acute. Severity:  severe. Timing:  constant. Duration:  few days. Context:  stressors, not taking his medications.  Past Medical History:  Past Medical History  Diagnosis Date  . Paranoid schizophrenia   . Bipolar affective disorder     Past Surgical History  Procedure Laterality Date  . Knee surgery    . Finger surgery     Family History: No family history on file. Social History:  History  Alcohol Use No     History  Drug Use No    History   Social History  . Marital Status: Single    Spouse Name: N/A  . Number of Children: N/A  . Years of Education: N/A    Social History Main Topics  . Smoking status: Current Every Day Smoker -- 0.50 packs/day  . Smokeless tobacco: Not on file  . Alcohol Use: No  . Drug Use: No  . Sexual Activity: Not on file   Other Topics Concern  . Not on file   Social History Narrative   Additional Social History:     Allergies:   Allergies  Allergen Reactions  . Fish-Derived Products   . Arlice Colt Isothiocyanate]   . Other      ketchup, soy sauce, peanuts  . Peanut-Containing Drug Products   . Pork-Derived Products Swelling    Vitals: Blood pressure 135/72, pulse 84, temperature 98.1 F (36.7 C), temperature source Oral, resp. rate 20, SpO2 100 %.  Risk to Self: Suicidal Ideation: No-Not Currently/Within Last 6 Months Suicidal Intent: No-Not Currently/Within Last 6 Months Is patient at risk for suicide?: No Suicidal Plan?: No-Not Currently/Within Last 6 Months Access to Means: No What has been your use of drugs/alcohol within the last 12 months?: none Intentional Self Injurious Behavior: None Risk to Others: Homicidal Ideation: No Thoughts of Harm to Others: No Current Homicidal Intent: No Current Homicidal Plan: No Access to Homicidal Means: No History of harm to others?: No Assessment of Violence: None Noted Does patient have access to weapons?: No Criminal Charges Pending?: No Does patient have a court date: No Prior Inpatient Therapy: Prior Inpatient Therapy: Yes Prior Therapy Dates: unknown Prior Therapy Facilty/Provider(s): [facility]  Reason for Treatment: Psychosis Prior Outpatient Therapy: Prior Outpatient Therapy: Yes Prior Therapy Dates: current [facility] Reason for Treatment: schizophrenia  Current Facility-Administered Medications  Medication Dose Route Frequency Provider Last Rate Last Dose  . 0.9 %  sodium chloride infusion  1,000 mL Intravenous Once Linwood DibblesJon Knapp, MD       Followed by  . 0.9 %  sodium chloride infusion  1,000 mL Intravenous  Continuous Linwood DibblesJon Knapp, MD      . benztropine (COGENTIN) tablet 1 mg  1 mg Oral BID Nanine MeansJamison Lord, NP   1 mg at 09/16/14 1006  . losartan (COZAAR) tablet 50 mg  50 mg Oral Daily Doug SouSam Jacubowitz, MD   50 mg at 09/16/14 1006   And  . hydrochlorothiazide (MICROZIDE) capsule 12.5 mg  12.5 mg Oral Daily Doug SouSam Jacubowitz, MD   12.5 mg at 09/16/14 1006  . midazolam (VERSED) injection 5 mg  5 mg Intramuscular Once Doug SouSam Jacubowitz, MD   Stopped at 09/14/14 0910  . risperiDONE (RISPERDAL) tablet 2 mg  2 mg Oral BID Nanine MeansJamison Lord, NP   2 mg at 09/16/14 1006  . traZODone (DESYREL) tablet 50 mg  50 mg Oral QHS PRN Nanine MeansJamison Lord, NP       Current Outpatient Prescriptions  Medication Sig Dispense Refill  . benztropine (COGENTIN) 1 MG tablet Take 1 tablet (1 mg total) by mouth 2 (two) times daily. 30 tablet 0  . losartan-hydrochlorothiazide (HYZAAR) 50-12.5 MG per tablet Take 1 tablet by mouth daily. 30 tablet 0  . risperiDONE (RISPERDAL) 2 MG tablet Take 1 tablet (2 mg total) by mouth 2 (two) times daily. 60 tablet 0  . traZODone (DESYREL) 50 MG tablet Take 1 tablet (50 mg total) by mouth at bedtime as needed for sleep. 30 tablet 0    Musculoskeletal: Strength & Muscle Tone: within normal limits Gait & Station: normal Patient leans: N/A  Psychiatric Specialty Exam:     Blood pressure 135/72, pulse 84, temperature 98.1 F (36.7 C), temperature source Oral, resp. rate 20, SpO2 100 %.There is no weight on file to calculate BMI.  General Appearance: Disheveled  Eye SolicitorContact::  Fair  Speech:  Pressured  Volume:  Increased  Mood:  Anxious and Irritable  Affect:  Blunt  Thought Process:  Disorganized and Tangential  Orientation:  Full (Time, Place, and Person)  Thought Content:  Hallucinations: Auditory Visual  Suicidal Thoughts:  No  Homicidal Thoughts:  No  Memory:  Immediate;   Fair Recent;   Fair Remote;   Fair  Judgement:  Impaired  Insight:  Lacking  Psychomotor Activity:  Increased   Concentration:  Fair  Recall:  FiservFair  Fund of Knowledge:Fair  Language: Good  Akathisia:  No  Handed:  Right  AIMS (if indicated):     Assets:  Housing Leisure Time Physical Health Resilience Social Support  ADL's:  Intact  Cognition: WNL  Sleep:      Medical Decision Making: Review of Psycho-Social Stressors (1), Review or order clinical lab tests (1), Established Problem, Worsening (2) and Review of Medication Regimen & Side Effects (2)  Treatment Plan Summary: Daily contact with patient to assess and evaluate symptoms and progress in treatment, Medication management and Plan admit to inpatient psychiatry for stabilization  Plan:  Recommend psychiatric Inpatient admission when medically cleared.  Disposition: Admit to inpatient psych when bed available  Earney NavyONUOHA, Ermon Sagan, C, PMHNP-BC 09/16/2014 12:16 PM
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
## 2014-09-16 NOTE — Progress Notes (Signed)
Patient ID: Charles Mccoyhomas R Kohler Jr., male   DOB: 04/25/1966, 49 y.o.   MRN: [id number] PER STATE REGULATIONS 482.30  THIS CHART WAS REVIEWED FOR MEDICAL NECESSITY WITH RESPECT TO THE PATIENT'S ADMISSION/DURATION OF STAY.  NEXT REVIEW DATE: 09/19/14  Loura HaltBARBARA Amandalee Lacap, RN, BSN CASE MANAGER
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
## 2014-09-17 ENCOUNTER — Encounter (HOSPITAL_COMMUNITY): Payer: Self-pay | Admitting: Psychiatry

## 2014-09-17 DIAGNOSIS — R748 Abnormal levels of other serum enzymes: Secondary | ICD-10-CM | POA: Diagnosis present

## 2014-09-17 DIAGNOSIS — I10 Essential (primary) hypertension: Secondary | ICD-10-CM | POA: Diagnosis present

## 2014-09-17 DIAGNOSIS — F209 Schizophrenia, unspecified: Principal | ICD-10-CM

## 2014-09-17 DIAGNOSIS — F205 Residual schizophrenia: Secondary | ICD-10-CM

## 2014-09-17 DIAGNOSIS — Z9114 Patient's other noncompliance with medication regimen: Secondary | ICD-10-CM

## 2014-09-17 LAB — OVA AND PARASITE EXAMINATION

## 2014-09-17 LAB — CK: CK TOTAL: 439 U/L — AB (ref 7–232)

## 2014-09-17 MED ORDER — DIVALPROEX SODIUM ER 500 MG PO TB24
500.0000 mg | ORAL_TABLET | Freq: Every day | ORAL | Status: DC
  Administered 2014-09-17: 500 mg via ORAL
  Filled 2014-09-17 (×2): qty 1

## 2014-09-17 MED ORDER — ENSURE COMPLETE PO LIQD
237.0000 mL | Freq: Three times a day (TID) | ORAL | Status: DC
  Administered 2014-09-17 – 2014-09-23 (×11): 237 mL via ORAL

## 2014-09-17 MED ORDER — LORAZEPAM 1 MG PO TABS
1.0000 mg | ORAL_TABLET | Freq: Four times a day (QID) | ORAL | Status: DC | PRN
Start: 2014-09-17 — End: 2014-09-23

## 2014-09-17 MED ORDER — LORAZEPAM 2 MG/ML IJ SOLN: 1.0000 mg | Freq: Four times a day (QID) | INTRAMUSCULAR | Status: DC | PRN
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
## 2014-09-17 NOTE — Progress Notes (Signed)
Adult Psychoeducational Group Note  Date:  09/17/2014 Time:  8:55 PM  Group Topic/Focus:  Wrap-Up Group:   The focus of this group is to help patients review their daily goal of treatment and discuss progress on daily workbooks.  Participation Level:  Active  Participation Quality:  Appropriate  Affect:  Appropriate  Cognitive:  Appropriate  Insight: Appropriate  Engagement in Group:  Engaged  Modes of Intervention:  Discussion  Additional Comments:The patient expressed he felt bless today.The patient also said that he attend the groups today.  Octavio Mannshigpen, Nicolasa Milbrath Lee 09/17/2014, 8:55 PM

## 2014-09-17 NOTE — Progress Notes (Signed)
Pt is a new admit to the hall today.  At the time of assessment, pt was observed lying in his bed awake.  He denies SI/HI/AV at this time.  He states he is doing fine and denies any pain or discomfort.  Pt was encouraged to make his needs known to staff.  He was informed of available meds and asked if he wanted anything to help him relax and sleep.  He declined any medications saying he did not need any medication.  Pt was pleasant and polite with Clinical research associatewriter.  Pt chose not to attend group tonight.  Support and encouragement offered.  Safety maintained with q15 minute checks.
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
## 2014-09-17 NOTE — [facility] Suicide Risk Assessment (Signed)
[facility]   Nursing information obtained from:    Demographic factors:    Current Mental Status:    Loss Factors:    Historical Factors:    Risk Reduction Factors:    Total Time spent with patient: 30 minutes Principal Problem: Chronic undifferentiated schizophrenia Diagnosis:   Patient Active Problem List   Diagnosis Date Noted  . Chronic undifferentiated schizophrenia [F20.5] 09/16/2014     Continued Clinical Symptoms:  Alcohol Use Disorder Identification Test Final Score (AUDIT): 0 The "Alcohol Use Disorders Identification Test", Guidelines for Use in Primary Care, Second Edition.  World Science writerHealth Organization [facility](WHO). Score between 0-7:  no or low risk or alcohol related problems. Score between 8-15:  moderate risk of alcohol related problems. Score between 16-19:  high risk of alcohol related problems. Score 20 or above:  warrants further diagnostic evaluation for alcohol dependence and treatment.   CLINICAL FACTORS:   Currently Psychotic Unstable or Poor Therapeutic Relationship Previous Psychiatric Diagnoses and Treatments  Psychiatric Specialty Exam: Physical Exam  Constitutional: He appears well-developed and well-nourished.  HENT:  Head: Normocephalic.  Eyes: Pupils are equal, round, and reactive to light.  Neck: Normal range of motion.  Cardiovascular: Normal rate.   Respiratory: Effort normal.  GI: Soft.  Musculoskeletal: Normal range of motion.  Neurological: He is alert.  Skin: Skin is warm.  Psychiatric: His mood appears anxious. His affect is labile. His speech is rapid and/or pressured. He is agitated and hyperactive. Thought content is delusional. Cognition and memory are impaired. He expresses impulsivity. He exhibits a depressed mood. He is inattentive.    Review of Systems  Unable to perform ROS: mental acuity  Constitutional: Negative.   HENT: Negative.   Eyes: Negative.   Respiratory: Negative.   Cardiovascular:  Negative.   Gastrointestinal: Negative.   Genitourinary: Negative.   Musculoskeletal: Negative.   Skin: Negative.     Blood pressure 120/82, pulse 96, temperature 98.2 F (36.8 C), temperature source Oral, resp. rate 16, height 5\' 9"  (1.753 m), weight 90.719 kg (200 lb).Body mass index is 29.52 kg/(m^2).   SUICIDE RISK:   Moderate:  Frequent suicidal ideation with limited intensity, and duration, some specificity in terms of plans, no associated intent, good self-control, limited dysphoria/symptomatology, some risk factors present, and identifiable protective factors, including available and accessible social support.  PLAN OF CARE: Please see H&P.   Medical Decision Making:  Review of Psycho-Social Stressors (1), Review or order clinical lab tests (1), Decision to obtain old records (1), Established Problem, Worsening (2), Review or order medicine tests (1), Review of Medication Regimen & Side Effects (2) and Review of New Medication or Change in Dosage (2)  I certify that inpatient services furnished can reasonably be expected to improve the patient's condition.   Taja Pentland md 09/17/2014, 2:55 PM

## 2014-09-17 NOTE — Progress Notes (Signed)
Pt reports that he has had a good day.  His sister visited early in the shift.  He wanted her to take his belongings home from his locker.  These items was taken from the locker and given to the sister and paperwork signed.  Sister also had a form that she had pt sign while they were visiting in his room that she said had to do with him getting housing after he is discharged.  Pt has been pleasant and cooperative with staff.  He denies SI/HI.  He denies AV at this time, but has been seen talking to himself.  Pt states he has gone to some groups today.  He has taken medications today, and depakote ER tonight per order.  Pt's thought processes are still tangential and disorganized, but he is easily redirectable.  Support and encouragement offered.  Safety maintained with q15 minute checks.

## 2014-09-17 NOTE — Progress Notes (Signed)
Pt alert and interactive on the milieu. Pt received medications this morning. Pt has been complaint and interactive on the unit for the majority of the day. Pt did attend some groups and went to the cafeteria for lunch. Pt remains very talkative and asks many question. Pt is appropriate with others. No current concerns. Pt denies any SI/HI/AH/VH. Pt remains safe.

## 2014-09-17 NOTE — [facility] Group Notes (Signed)
[facility] LCSW Group Therapy  09/17/2014 , 4:21 PM   Type of Therapy:  Group Therapy  Participation Level:  Active  Participation Quality:  Attentive  Affect:  Appropriate  Cognitive:  Alert  Insight:  Improving  Engagement in Therapy:  Engaged  Modes of Intervention:  Discussion, Exploration and Socialization  Summary of Progress/Problems: Today's group focused on the term Diagnosis.  Participants were asked to define the term, and then pronounce whether it is a negative, positive or neutral term.  Charles Cortez was tangential and disorganized throughout, although goal directed.  He left group early on because he was not happy with being cut off by the leader, but later returned, and waited to be called on before speaking.  His responses were vaguely related to the topic at hand, but increasingly difficult to follow the longer he spoke.  It is clear he enjoys being around others.  Charles Cortez, Charles Cortez 09/17/2014 , 4:21 PM

## 2014-09-17 NOTE — Tx Team (Addendum)
  Interdisciplinary Treatment Plan Update   Date Reviewed:  09/17/2014  Time Reviewed:  8:34 AM  Progress in Treatment:   Attending groups: Yes Participating in groups: Yes Taking medication as prescribed: Yes  Tolerating medication: Yes Family/Significant other contact made: Yes  Patient understands diagnosis: Yes  Discussing patient identified problems/goals with staff: Yes  See initial care plan Medical problems stabilized or resolved: Yes Denies suicidal/homicidal ideation: Yes  In tx team Patient has not harmed self or others: Yes  For review of initial/current patient goals, please see plan of care.  Estimated Length of Stay:    Reason for Continuation of Hospitalization: Other; describe psychosis  Medication stabliization Mood instability  New Problems/Goals identified:  N/A  Discharge Plan or Barriers:  Patient unable to return to apartment  Sister has spoken w [facility], CSW submitted PASARR and FL2 for review, discussing patient wishes re discharge and finances, patient concerned that he will lose his check and be unable to support his daughter if he admits to ALF, CSW discussing options w patient and sister.  Additional Comments:  Charles Mccoyhomas R Fricker Jr. is a 49 y.o. male patient admitted with psychosis.  HPI: The patient has improved today but continues to ramble with disorganization at times and some irritability. He also has increase energy and pressured speech. [name] wants to leave but not stable at this time. His sisters are here and going to get interim guardianship tomorrow. They would like him to go inpatient for stabilization and then to a group home.   Reviewed above note with updates. Patient remains disorganized this morning and contentiously writing notes to the providers asking to be discharged home. He is constantly coming to the nursing station or provider door to speak to somebody.  Attendees:  Signature: Ivin BootySarama Eappen, MD 09/17/2014  8:34 AM   Signature: Richelle Itood North, LCSW 09/17/2014 8:34 AM  Signature: Fransisca KaufmannLaura Davis, NP 09/17/2014 8:34 AM  Signature: Joslyn Devonaroline Beaudry, RN 09/17/2014 8:34 AM  Signature:  09/17/2014 8:34 AM  Signature:  09/17/2014 8:34 AM  Signature:   09/17/2014 8:34 AM  Signature:    Signature:    Signature:    Signature:    Signature:    Signature:      Scribe for Treatment Team:   Richelle Itood North, LCSW  09/17/2014 8:34 AM

## 2014-09-17 NOTE — Progress Notes (Signed)
Adult Psychoeducational Group Note  Date:  09/17/2014 Time: 0915   Group Topic/Focus:  Orientation:   The focus of this group is to educate the patient on the purpose and policies of crisis stabilization and provide a format to answer questions about their admission.  The group details unit policies and expectations of patients while admitted.  Participation Level:  Active  Participation Quality:  Attentive, Monopolizing and Redirectable  Affect:  Anxious  Cognitive:  Alert and Disorganized  Insight: Good  Engagement in Group:  Monopolizing  Modes of Intervention:  Activity, Discussion, Education and Support  Additional Comments:  Pt able to identify one daily goal. Pt engaged in group.   Aurora Maskwyman, Nichlos Kunzler E 09/17/2014, 9:53 AM

## 2014-09-18 DIAGNOSIS — F209 Schizophrenia, unspecified: Secondary | ICD-10-CM | POA: Diagnosis present

## 2014-09-18 MED ORDER — TUBERCULIN PPD 5 UNIT/0.1ML ID SOLN
5.0000 [IU] | Freq: Once | INTRADERMAL | Status: AC
  Administered 2014-09-18: 5 [IU] via INTRADERMAL

## 2014-09-18 MED ORDER — RISPERIDONE 3 MG PO TABS
3.0000 mg | ORAL_TABLET | Freq: Every evening | ORAL | Status: DC
  Administered 2014-09-18: 3 mg via ORAL
  Filled 2014-09-18 (×2): qty 1
  Filled 2014-09-18: qty 3

## 2014-09-18 MED ORDER — TRAZODONE HCL 100 MG PO TABS
100.0000 mg | ORAL_TABLET | Freq: Every day | ORAL | Status: DC
  Administered 2014-09-20 – 2014-09-22 (×3): 100 mg via ORAL
  Filled 2014-09-18: qty 1
  Filled 2014-09-18: qty 3
  Filled 2014-09-18 (×5): qty 1

## 2014-09-18 MED ORDER — RISPERIDONE 2 MG PO TABS
2.0000 mg | ORAL_TABLET | Freq: Every day | ORAL | Status: DC
  Administered 2014-09-19 – 2014-09-23 (×5): 2 mg via ORAL
  Filled 2014-09-18: qty 12
  Filled 2014-09-18 (×6): qty 1

## 2014-09-18 MED ORDER — DIVALPROEX SODIUM ER 500 MG PO TB24
1000.0000 mg | ORAL_TABLET | Freq: Every day | ORAL | Status: DC
  Administered 2014-09-20 – 2014-09-22 (×3): 1000 mg via ORAL
  Filled 2014-09-18 (×6): qty 2

## 2014-09-18 NOTE — [facility] Group Notes (Signed)
[facility] LCSW Group Therapy  09/18/2014 1:45 PM  Type of Therapy:  Group Therapy  Participation Level:  Active  Participation Quality:  Intrusive   Affect:  Flat  Cognitive:  Alert  Insight:  Limited  Engagement in Therapy:  Limited  Modes of Intervention:  Discussion, Education, Socialization and Support  Summary of Progress/Problems:Mental Health Association (MHA) speaker came to talk about his personal journey with substance abuse and mental illness. Group members were challenged to process ways by which to relate to the speaker. MHA speaker provided handouts and educational information pertaining to groups and services offered by the [facility]. Maliq attended group. He would raise his hand and say "I gotta get this out." He discussed his sobriety and faith. While the speaker was playing music, he began preaching and praying out loud.   Hyatt,Candace 09/18/2014, 1:45 PM

## 2014-09-18 NOTE — Progress Notes (Signed)
[facility] [place] [facility]  09/18/2014 12:11 PM Charles Mccoyhomas R Winburn Jr.  MRN:  [id number] Subjective:  Patient states "I am fine , I feel a bit anxious since I need to figure out housing, I do not belong here.'  Objective:Patient seen and chart reviewed.Patient discussed with treatment team. Patient presented very disorganized as well as delusional , after he destroyed his toilet bowl since he saw "tidey bowl men ' in there. Patient also felt something wiggling in his back that made him destroy his apartment to get to the bottom of this . Patient reported having hallucinations of these "little men" as well as seeing his "little boo " (his ex girl friend ) as well as small boo "his daughter , on a regular basis ( even though they live hundreds of miles away from him ". Patient today continues to be pressured, hyperactive , euphoric as well as anxious. Patient seen as very restless ,pacing the hallway , speaking non stop , needing redirection. Patient is compliant on medications. Patient denies SE. Patient with elevated CPK level on admission , now it is down trending. Per hospitalist consult -could be due to agitation - recommended to keep hydrated . Will encourage po fluids. No other issues noted today. [facility] ,since patient per his sister's cannot go back to his apartment. Patient cannot take care of self and needs to be in a supervised setting. PASARR initiated . Sister's working on guardianship- has upcoming court date.      Principal Problem: Schizophrenia, multiple episodes ,currently in acute episode Diagnosis:   Diagnosis:  Primary Psychiatric Diagnosis: Schizophrenia , multiple episodes ,currently in acute episode   Secondary Psychiatric Diagnosis: Noncompliant with medications   Non Psychiatric Diagnosis: CPK elevated- down trending  Patient Active Problem List   Diagnosis Date Noted  . Schizophrenia [F20.9] 09/18/2014  . Elevated CPK [R74.8] 09/17/2014  . HTN  (hypertension) [I10] 09/17/2014   Total Time spent with patient: 30 minutes   Past Medical History:  Past Medical History  Diagnosis Date  . Paranoid schizophrenia   . Bipolar affective disorder     Past Surgical History  Procedure Laterality Date  . Knee surgery    . Finger surgery     Family History: History reviewed. No pertinent family history. Social History:  History  Alcohol Use No     History  Drug Use No    History   Social History  . Marital Status: Single    Spouse Name: N/A  . Number of Children: N/A  . Years of Education: N/A   Social History Main Topics  . Smoking status: Current Every Day Smoker -- 0.50 packs/day  . Smokeless tobacco: Not on file  . Alcohol Use: No  . Drug Use: No  . Sexual Activity: Not on file   Other Topics Concern  . None   Social History Narrative   Additional History:    Sleep: Fair  Appetite:  Fair      Musculoskeletal: Strength & Muscle Tone: within normal limits Gait & Station: normal Patient leans: N/A   Psychiatric Specialty Exam: Physical Exam  Review of Systems  Psychiatric/Behavioral: Negative for depression. The patient is nervous/anxious.     Blood pressure 140/96, pulse 95, temperature 97.5 F (36.4 C), temperature source Oral, resp. rate 18, height 5\' 9"  (1.753 m), weight 90.719 kg (200 lb).Body mass index is 29.52 kg/(m^2).  General Appearance: Disheveled  Eye Contact::  Good  Speech:  Pressured  Volume:  Increased  Mood:  Anxious and Euphoric  Affect:  Labile  Thought Process:  Disorganized  Orientation:  Full (Time, Place, and Person)  Thought Content:  Delusions  Suicidal Thoughts:  No  Homicidal Thoughts:  No  Memory:  Immediate;   Fair Recent;   Poor Remote;   Poor  Judgement:  Impaired  Insight:  Lacking  Psychomotor Activity:  Increased  Concentration:  Fair  Recall:  [employer] of Knowledge:Fair  Language: Fair  Akathisia:  No  Handed:  Right  AIMS (if indicated):      Assets:  Communication Skills Social Support  ADL's:  Intact  Cognition: WNL  Sleep:  Number of Hours: 4.5     Current Medications: Current Facility-Administered Medications  Medication Dose Route Frequency Provider Last Rate Last Dose  . alum & mag hydroxide-simeth (MAALOX/MYLANTA) 200-200-20 MG/5ML suspension 30 mL  30 mL Oral Q4H PRN Earney Navy, NP      . benztropine (COGENTIN) tablet 1 mg  1 mg Oral BID Earney Navy, NP   1 mg at 09/18/14 0823  . divalproex (DEPAKOTE ER) 24 hr tablet 1,000 mg  1,000 mg Oral QHS Rexanna Louthan, [place]      . feeding supplement (ENSURE COMPLETE) (ENSURE COMPLETE) liquid 237 mL  237 mL Oral TID BM Kapri Nero, [place]   237 mL at 09/18/14 1034  . losartan (COZAAR) tablet 50 mg  50 mg Oral Daily Earney Navy, NP   50 mg at 09/18/14 5621   And  . hydrochlorothiazide (MICROZIDE) capsule 12.5 mg  12.5 mg Oral Daily Earney Navy, NP   12.5 mg at 09/18/14 0824  . LORazepam (ATIVAN) tablet 1 mg  1 mg Oral Q6H PRN Jomarie Longs, [place]       Or  . LORazepam (ATIVAN) injection 1 mg  1 mg Intramuscular Q6H PRN Jacques Willingham, [place]      . magnesium hydroxide (MILK OF MAGNESIA) suspension 30 mL  30 mL Oral Daily PRN Earney Navy, NP      . Melene Muller ON 09/19/2014] risperiDONE (RISPERDAL) tablet 2 mg  2 mg Oral Daily Talin Feister, [place]      . risperiDONE (RISPERDAL) tablet 3 mg  3 mg Oral QPM Julianne Chamberlin, [place]      . traZODone (DESYREL) tablet 50 mg  50 mg Oral QHS PRN Earney Navy, NP      . tuberculin injection 5 Units  5 Units Intradermal Once Jomarie Longs, [place]   5 Units at 09/18/14 1150    Lab Results:  Results for orders placed or performed during the hospital encounter of 09/16/14 (from the past 48 hour(s))  CK     Status: Abnormal   Collection Time: 09/17/14  7:55 PM  Result Value Ref Range   Total CK 439 (H) 7 - 232 U/L    Comment: Performed at [facility]    Physical Findings: AIMS: Facial and Oral  Movements Muscles of Facial Expression: None, normal Lips and Perioral Area: None, normal Jaw: None, normal Tongue: None, normal,Extremity Movements Upper (arms, wrists, hands, fingers): None, normal Lower (legs, knees, ankles, toes): None, normal, Trunk Movements Neck, shoulders, hips: None, normal, Overall Severity Severity of abnormal movements (highest score from questions above): None, normal Incapacitation due to abnormal movements: None, normal Patient's awareness of abnormal movements (rate only patient's report): No Awareness, Dental Status Current problems with teeth and/or dentures?: No Does patient usually wear dentures?: No  CIWA:  COWS:     Assessment:Patient is a 49 year old AAM with hx of Schizophrenia , presented very disorganized ,delusional and euphoric with pressured speech as well as restlessness. Patient continues to be hyperactive and psychotic needing medication readjustment.    Treatment Plan Summary: Daily contact with patient to assess and evaluate symptoms and progress in treatment and Medication management Will increase Risperdal 5 mg po daily. Will continue Cogenitn 0.5 mg po bid. Will increase Depakote ER to  po qhs  for mood lability.Depakote level in 5 days. Will continue  Ativan po/IM for agitation. Increase Trazodone to 100 mg po qhs for sleep. Hospitalist consult was placed for elevated CPK level . Per hospitalist CPK trending down and is likely due to altercation prior to admission. Based on face to face discussion with Hospitalist , its ok to continue Risperdal as well as increase the dose .Will continue to monitor. Will encourage po fluids.CPK repeat - downtrending. Will continue to monitor vitals ,medication compliance and treatment side effects while patient is here.  Will monitor for medical issues as well as call consult as needed.  Reviewed labs ,will order as needed.  CSW will start working on disposition. PASARR initiated for [facility]  placement. PPD ordered today - 09/18/14 Patient to participate in therapeutic milieu   Medical Decision Making:  Review of Psycho-Social Stressors (1), Review or order clinical lab tests (1), New Problem, with no additional work-up planned (3), Review of Last Therapy Session (1), Review of Medication Regimen & Side Effects (2) and Review of New Medication or Change in Dosage (2)     Kebrina Friend [place] 09/18/2014, 12:11 PM

## 2014-09-18 NOTE — Progress Notes (Signed)
D:Pt is tangential and disorganized talking from one subject to another. Pt reports playing basketball with Michael SwazilandJordan after he went to the gym. Pt wrote his name on a styrofoam and pointed at his name band. Pt then reported that he did not know the name on the cup. Pt rates depression as a 10 on 1-10 scale with 10 being the most.  A:Offered support, encouragement and 15 minute checks. Gave medication as ordered. Gave PPD test. R:Pt denies si and hi. Safety maintained on the unit.

## 2014-09-18 NOTE — [facility] Group Notes (Signed)
[facility]   09/18/2014 3:17 PM  Participation Quality:  Active   Mood/Affect:  Appropriate  Depression Rating:  Unable to answer  Anxiety Rating:  Unable to answer   Thoughts of Suicide:  No Will you contract for safety?   NA  Current AVH:  No  Plan for Discharge/Comments:  Charles [place] is concerned about finding housing. He refused to answer questions. He only wanted to talk about taking care of his family.   Transportation Means: Family   Supports:Family   Hyatt,Candace

## 2014-09-18 NOTE — [facility] Counselor (Signed)
Adult Comprehensive Assessment  Patient ID: Charles [place]., male   DOB: [birth year], 49 y.o.   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 Family Relationships: Charles [place] is "baby momma" - per sister, this relationship is very conflicted; patient has 21 yo daughter, patient sees infrequently Surveyor, quantity / Lack of resources (include bankruptcy): on disability, limited resources, owes areers to [employer], feels obligated to send money to support child Housing / Lack of housing: sister renting apartment, patient cannot return at [employer] due to his disruptive behavior Physical health (include injuries & life threatening diseases): no issues Social relationships: isolated 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  Sister describes living situation as "filthy" states patient tore toilet out of bathroom, smashed furniture, unclean, neighbors afraid of patients behaviors in parking lot  How long has patient lived in current situation?: several yars What is atmosphere in current home: Chaotic  Family History:  Marital status: Single Does patient have children?: Yes How many children?: 1 How is patient's relationship with their children?: Sees infrequently, although per sister, patient is frequently asked for money by daughter's mother  Childhood History:  By whom was/is the patient raised?: Mother, Father Additional childhood history information: Raised in [place], moved to [place] w family as an adult Description of patient's relationship with caregiver when  they were a child: Loving family, mother and father involved Patient's description of current relationship with people who raised him/her: Mother is ill w dementia and Parkinsons, father alive, both live w sister Charles [place], patient used to "lean on" mother for significant support Does patient have siblings?: Yes Number of Siblings: 3 Description of patient's current relationship with siblings: Sisters in [place], [place] and [place] Did patient suffer any verbal/emotional/physical/sexual abuse as a child?: No Did patient suffer from severe childhood neglect?: No Has patient ever been sexually abused/assaulted/raped as an adolescent or adult?: No Was the patient ever a victim of a crime or a disaster?: No Witnessed domestic violence?: No Has patient been effected by domestic violence as an adult?: No  Education:  Highest grade of school patient has completed: 12 - [employer], [place] Currently a Consulting civil engineer?: No Learning disability?: No  Employment/Work Situation:   Employment situation: Unemployed Patient's job has been impacted by current illness: Yes Describe how patient's job has been impacted: Was doing work through [employer], temporary work, approx 7 months, has always had jobs on and off What is the longest time patient has a held a job?: approx one year 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 Has patient ever been in the [employer]?: No Has patient ever served in combat?: No  Financial Resources:   Surveyor, quantity resources: Writer, Medicaid, Medicare Does patient have a representative payee or guardian?: No  Alcohol/Substance Abuse:   What has been your use of drugs/alcohol within the last 12 months?: States he has been sober 13 years Alcohol/Substance Abuse Treatment Hx:  (Attended AA at some time in past, has "chip") Has alcohol/substance abuse ever caused legal problems?: No  Social Support System:   [employer]  System: Fair Museum/gallery exhibitions officer System: Very socially isolated, has child and was in relationship w her mother, does not see either one much at present, per sister, the mother of his child causes much "drama", asks patient for money frequently, patient admits he wires money to her via [employer] and feels obligated to contribute to support of daughter and an infant which sister says is not his child Type of faith/religion: None How does patient's faith help to cope with current illness?: NA  Leisure/Recreation:   Leisure and Hobbies: At one point, patient participated in sports, expresses interest in exercise and physical activity  Strengths/Needs:   What things does the patient do well?: Patient says he is as good as Charisse March in basketball, but was unable to play due to coaching issues, appears devoted to his daughter In what areas does patient struggle / problems for patient: Social isolation, fear of medication ("it could be poison"), inconsistent adherence to outpatient med mgmt, not sure he has illness causing disruption to his life  Discharge Plan:   Does patient have access to transportation?: Yes Plan for no access to transportation at discharge: sister assists w transport to appointments Will patient be returning to same living situation after discharge?: No Plan for living situation after discharge: Sister arranging housing at [facility], has applied for Special Assistance Medicaid, requests [facility] complete FL2 and Pasarr screen.   Currently receiving community mental health services:  (Current w [facility] for medications management, sister wants him seen by PCP E Avbuere, has appt for April 2016) If no, would patient like referral for services when discharged?:  (Sister arranging new PCP) Does patient have financial barriers related to discharge medications?: No  Summary/Recommendations:    Patient is a 49 yo AA male, diagnosed w schizophrenia approx 10  years ago per family, followed for outpatient medications management by [facility].  Patient lived alone in apartment rented by sister, states his problem at admission was "housing" - sister will not allow him to return to apartment due to his disruptive behavior (destroying toilet because he thought there was someone in it, "filthy" living conditions, odd behavior in parking lot perceived as threatening by neighbors (patient reports he was exercising vigorously), yelling/disturbing the peace, not taking care of himself or environment, turning furniture upside down and throwing out many possessions and clothing items..  Brought to [facility] via [facility] for evaluation and admission. Patient is disorganized and tangential in his speech, attempted to tell CSW about his income from SSI and where he owes money to both arrears and for child support, stressed that he wants to retain funds to pay for his 27 year old daughters support.  States he sends money to her mother via [employer], but is not sure whether she receives it.  Sister is now taking over much of patient's support - formerly was done by mother who is now in poor health.  Sister states she is filing for guardianship, has appt on 3.22 to discuss this.  Has contacted [facility] and says that patient will be admitted, has also applied for Special Assistance Medicaid on patient's behalf Patsy Lager Headen [phone]).  Requests [facility] provide FL2 and PASARR to Fatima Blank in order to process his admission.  CSW discussed ALF placement w patient - he is willing to consider this while he waits for his Section 8 voucher to be approved (sister states app is in process) - concerned that he will not have enough income  to support his daughter and meet his obligations.  Patient will benefit from hospitalization to receive psychoeducation and group therapy services to increase coping skills for and understanding of schizophrenia, milieu therapy, medications  management, and nursing support.  Patient will develop appropriate coping skills for dealing w overwhelming emotions, stabilize on medications, and develop greater insight into and acceptance of his current illness.  CSWs will develop discharge plan to include family support and referral to appropriate after care services.  Sallee Langeunningham, Udell Blasingame C. 09/18/2014

## 2014-09-18 NOTE — Clinical Social Work Note (Signed)
CSW spoke w Fatima BlankMary Fox, administrator of [facility], faxed copy of FL2 to her for review.  PASARR submitted, awaiting evaluator assignment, Tb test requested.     Santa GeneraAnne Cunningham, LCSW Clinical Social Worker

## 2014-09-18 NOTE — Progress Notes (Signed)
Pt has been in his room most of the evening.  He says he is fine.  He refused to take his scheduled Depakote and Trazodone.  He even refused the Ensure, saying he did not need anything.  He was dozing in his bed when writer went to do his assessment.  He denies SI/HI/AV at this time.  He seems more guarded and cautious tonight than the previous night.  He states he does not need anything at this time and voices no concerns.  When pt refused his meds, he was reminded that taking his medication would help clear his thoughts so that he could be discharged, but he still would not take them.  Support and encouragement offered.  Discharge plans still in process.  Safety maintained with q15 minute checks.

## 2014-09-19 MED ORDER — RISPERIDONE 2 MG PO TABS
4.0000 mg | ORAL_TABLET | Freq: Every evening | ORAL | Status: DC
  Administered 2014-09-19 – 2014-09-22 (×4): 4 mg via ORAL
  Filled 2014-09-19 (×6): qty 2

## 2014-09-19 NOTE — Progress Notes (Signed)
D: Pt visible in dayroom majority of this shift.   A: Medications administered as per MD's orders. Support and availability offered. 1:1 contact made with pt to conduct shift assessment. Medication education done and verbal encouragement offered to pt to increase insight and promote compliance with ordered treatment regimen. Q 15 minutes checks remains effective as ordered.  R: Pt cooperative with unit routines and care thus far. Took his medications as scheduled when offered. Denied SI / HI / pain, AVH when assessed. However, pt noted to be disorganized, tangential with rapid speech during AM group. Pt could not stay on topic. Pt is pleasant when approached, affect bright at intervals when participating with others. Voiced no concerns to staff at this time. Safety maintained on / off unit. Continue POC.

## 2014-09-19 NOTE — Plan of Care (Signed)
Problem: Diagnosis: Increased Risk For Suicide Attempt Goal: STG-Patient Will Report Suicidal Feelings to Staff Outcome: Progressing Pt denied feeling suicidal when assessed this shift. Q 15 minutes checks maintained as per order without gestures to harm self thus far.

## 2014-09-19 NOTE — Clinical Social Work Note (Signed)
[facility]  Santa GeneraAnne Aleese Kamps, LCSW Clinical Social Worker

## 2014-09-19 NOTE — [facility] Group Notes (Signed)
[facility] Group Notes:  (Nursing---Leisure & Lifestyle Changes)  Date:  09/19/2014  Time:  0930  Type of Therapy:  Psychoeducational Skills  Participation Level:  Active  Participation Quality:  Appropriate, Sharing and Supportive  Affect:  Appropriate  Cognitive:  Disorganized  Insight:  Limited  Engagement in Group:  Engaged and Limited  Modes of Intervention:  Discussion  Summary of Progress/Problems: Pt was engaged though he could not stay on topic.   Ouida SillsWesseh, Lincoln Maxinlivette 09/19/2014, 0930

## 2014-09-19 NOTE — Progress Notes (Signed)
Adult Psychoeducational Group Note  Date:  09/19/2014 Time:  12:13 AM  Group Topic/Focus:  Wrap-Up Group:   The focus of this group is to help patients review their daily goal of treatment and discuss progress on daily workbooks.  Participation Level:  Active  Participation Quality:  Appropriate  Affect:  Labile  Cognitive:  Lacking  Insight: Lacking  Engagement in Group:  Engaged  Modes of Intervention:  Socialization and Support  Additional Comments:  Patient attended and participated in group tonight. He reports that he got up early in the morning and took a shower to get ready for the day. He went to all his meals and attended his groups. He advised that he had a good day. 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 He has a coin that whenever he is hurting he use the coin to touch the area and it feels better. He does not need medications.  The patient reports that he constantly thinks about his daughter.  Lita MainsFrancis, Katalyn Matin [facility] 09/19/2014, 12:13 AM

## 2014-09-19 NOTE — Clinical Social Work Note (Signed)
[facility] can accept patient at discharge, requested updated clinicals, Tb test results.  Updated clinicals faxed.  Awaiting PASARR and Tb test read.  Sister, Charles Cortez, asked for update; VM left for her.  Santa GeneraAnne Cunningham, LCSW Clinical Social Worker

## 2014-09-19 NOTE — [facility] Group Notes (Signed)
[facility] Group Notes:  (Counselor/Nursing/MHT/Case Management/Adjunct)  09/19/2014 1:15PM  Type of Therapy:  Group Therapy  Participation Level:  Active  Participation Quality:  Appropriate  Affect:  Flat  Cognitive:  Oriented  Insight:  Improving  Engagement in Group:  Limited  Engagement in Therapy:  Limited  Modes of Intervention:  Discussion, Exploration and Socialization  Summary of Progress/Problems: The topic for group was balance in life.  Pt participated in the discussion about when their life was in balance and out of balance and how this feels.  Pt discussed ways to get back in balance and short term goals they can work on to get where they want to be. Charles Cortez started out saying he was balanced, but as he went on he talked about his relationship with his daughter, which led him to feel like he can always improve as a parent, which caused him to second guess his original premise. He got stuck on a misinterpretation of what he was saying, and spent an inordinate amount of time trying to clarify what he really meant.  In the end, it was still unclear.   Charles Cortez, Charles Cortez 09/19/2014 11:30 AM
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
## 2014-09-20 LAB — COMPREHENSIVE METABOLIC PANEL
ALK PHOS: 69 U/L (ref 39–117)
ALT: 38 U/L (ref 0–53)
ANION GAP: 6 (ref 5–15)
AST: 30 U/L (ref 0–37)
Albumin: 4 g/dL (ref 3.5–5.2)
BILIRUBIN TOTAL: 0.4 mg/dL (ref 0.3–1.2)
BUN: 10 mg/dL (ref 6–23)
CALCIUM: 9.3 mg/dL (ref 8.4–10.5)
CO2: 28 mmol/L (ref 19–32)
CREATININE: 0.88 mg/dL (ref 0.50–1.35)
Chloride: 101 mmol/L (ref 96–112)
GFR calc Af Amer: >90 mL/min (ref >90)
GFR calc non Af Amer: >90 mL/min (ref >90)
Glucose, Bld: 126 mg/dL — ABNORMAL HIGH (ref 70–99)
Potassium: 3.9 mmol/L (ref 3.5–5.1)
SODIUM: 135 mmol/L (ref 135–145)
TOTAL PROTEIN: 6.7 g/dL (ref 6.0–8.3)

## 2014-09-20 NOTE — Progress Notes (Signed)
Patient ID: Charles Cortez., male   DOB: 01/28/1966, 49 y.o.   MRN: [id number] PER STATE REGULATIONS 482.30  THIS CHART WAS REVIEWED FOR MEDICAL NECESSITY WITH RESPECT TO THE PATIENT'S ADMISSION/ DURATION OF STAY.  NEXT REVIEW DATE: 09/24/2014  Willa RoughJENNIFER JONES Aamira Bischoff, RN, BSN CASE MANAGER

## 2014-09-20 NOTE — Clinical Social Work Note (Signed)
FL2 provided to Charles Cortez, [facility] per her request to assist w placement at ALF.  Santa GeneraAnne Anish Vana, LCSW Clinical Social Worker

## 2014-09-20 NOTE — [facility] Group Notes (Signed)
[facility] LCSW Group Therapy  09/20/2014 1:22 PM  Type of Therapy:  Group Therapy  Participation Level:  Active  Participation Quality:  Attentive  Affect:  Excited  Cognitive:  Alert  Insight:  Limited  Engagement in Therapy:  Limited  Modes of Intervention:  Discussion, Socialization and Support  Summary of Progress/Problems:Feelings around Relapse. Group members discussed the meaning of relapse and shared personal stories of relapse, how it affected them and others, and how they perceived themselves during this time. Group members were encouraged to identify triggers, warning signs and coping skills used when facing the possibility of relapse. Social supports were discussed and explored in detail. "Hope is a way of life. Like I hope that baby mine or I hope I don't run out of gas . I hope God hears my cry for my loved ones." He stated his loved ones such as his daughter, helps him believe in himself. [name] chose a picture of three eggs. This picture was hopeful because it represents his family; his daughter, her mom and himself.    Hyatt,Candace 09/20/2014, 1:22 PM
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
## 2014-09-20 NOTE — [facility] Group Notes (Signed)
[facility]   09/20/2014 11:29 AM  Participation Quality:  Active   Mood/Affect:  Appropriate  Depression Rating:  0  Anxiety Rating: 0    Thoughts of Suicide:  No Will you contract for safety?   NA  Current AVH:  No  Plan for Discharge/Comments:  Charles Cortez is tangential but improving. [facility]  Pt is concerned about his IVC paperwork that was provided by a police officer yesterday. He is concerned if he does not go to the court date he will be arrested. He states he will call to make sure he does not have to go.   Transportation Means: Family/bus   Supports:Family   Hyatt,Candace

## 2014-09-20 NOTE — Tx Team (Signed)
  Interdisciplinary Treatment Plan Update   Date Reviewed:  09/20/2014  Time Reviewed:  9:05 AM  Progress in Treatment:   Attending groups: Yes Participating in groups: Yes Taking medication as prescribed: Yes  Tolerating medication: Yes Family/Significant other contact made: Yes  Patient understands diagnosis: Yes  Discussing patient identified problems/goals with staff: Yes  See initial care plan Medical problems stabilized or resolved: Yes Denies suicidal/homicidal ideation: Yes  In tx team Patient has not harmed self or others: Yes  For review of initial/current patient goals, please see plan of care.  Estimated Length of Stay:    Reason for Continuation of Hospitalization: Other; describe psychosis  Medication stabliization Mood instability  New Problems/Goals identified:  N/A  Discharge Plan or Barriers:  Patient unable to return to apartment  Sister has spoken w [facility], CSW submitted PASARR and FL2 for review, discussing patient wishes re discharge and finances, patient concerned that he will lose his check and be unable to support his daughter if he admits to ALF, CSW discussing options w patient and sister.  Additional Comments:  Charles Mccoyhomas R Lozon Jr. is a 49 y.o. male patient admitted with psychosis.  HPI: The patient has improved today but continues to ramble with disorganization at times and some irritability. He also has increase energy and pressured speech. Charles Cortez wants to leave but not stable at this time. His sisters are here and going to get interim guardianship tomorrow. They would like him to go inpatient for stabilization and then to a group home.   Reviewed above note with updates. Patient remains disorganized this morning and contentiously writing notes to the providers asking to be discharged home. He is constantly coming to the nursing station or provider door to speak to somebody.  3/4:  [facility]   Concerned about "how much the rent is", advised to talk to sister re his finances, aware that ALF will take his check and leave him w small amount of spending money.  Guardianship court date is 10/08/14.  MD will discuss placement on injectable at discharge.  Per sister , patient was more stable when receiving medication via injection.  Current medications are Risperal 2 mg, Trazodone 50 mg at bedtime for sleep, Cogentin 1 mg.    Attendees:  Signature: Ivin BootySarama Eappen, MD 09/20/2014 9:05 AM   Signature: Richelle Itood North, LCSW 09/20/2014 9:05 AM  Signature: Onnie BoerJennifer Clark RN 09/20/2014 9:05 AM  Signature: Leisa LenzValerie Enoch, [facility] 09/20/2014 9:05 AM  Signature: Santa Generanne Cunningham, LCSW 09/20/2014 9:05 AM  Signature: Charleston Ropesandace Hyatt, SW Intern 09/20/2014 9:05 AM  Signature:  Ronita Hippshris J RN 09/20/2014 9:05 AM  Signature:    Signature:    Signature:    Signature:    Signature:    Signature:      Scribe for Treatment Team:   Santa GeneraAnne Cunningham, LCSW  09/20/2014 9:05 AM

## 2014-09-20 NOTE — Progress Notes (Signed)
D) Pt has attended the groups and interacts with his peers. Pt remains hyper verbal at times and will get tangential with his thoughts and explanations. Denies auditory and visual hallucinations. Resting at different times during the day.  A) Provided with short frequent 1:1's. Given support and reassurance. Therapeutic relationship established with Pt. R) Pt denies SI and HI.

## 2014-09-20 NOTE — Plan of Care (Signed)
Problem: Diagnosis: Increased Risk For Suicide Attempt Goal: STG-Patient Will Attend All Groups On The Unit Outcome: Progressing Pt attended evening group on 09/20/14.

## 2014-09-20 NOTE — Progress Notes (Signed)
D: Pt has labile affect and mood.  When writer initially met with pt, pt became irritable and started using profanity.  Pt stated "just look at the sheet.  I'm tired of answering all the same questions all the time."  Pt reported he had a visit and "all of my stuff go well."  Pt reports his goal is to "get back to my family, get my housing together.  I'm trying to get up out of here."  Pt denies SI/HI, denies hallucinations, denies pain.  Pt attended evening group. Later in shift, pt approached Probation officer and apologized for the way he spoke to Probation officer. A: Medications administered per order.  Pt reported "they keep changing up my medication."  Pt educated on the medications scheduled and pt was compliant with medications.  Safety maintained.  Met with pt 1:1 and offered support and encouragement.   R: Pt verbally contracts for safety.  He is safe on the unit.  Will continue to monitor and assess for safety.

## 2014-09-21 NOTE — [facility] Group Notes (Signed)
[facility] Group Notes:  (Clinical Social Work)  09/21/2014  11:15-12:00PM  Summary of Progress/Problems:   The main focus of today's process group was to discuss patients' feelings about hospitalization, the stigma attached to mental health, and sources of motivation to stay well.  We then worked to identify a specific plan to avoid future hospitalizations when discharged from the hospital for this admission.  The patient expressed himself throughout group, was monopolizing but on topic.  He initially refused to answer [name] question regarding favorite kind of weather, later talking about how dangerous all kinds of weather is.  He talked about being stubborn a good deal, and how that has caused him to end up being hospitalized, giving vivid examples of people he has known who have cost people their lives due to having what he called the "f--- its."  He stated in order to stay well and out of the hospital he will have to take his medicine and pray.  Type of Therapy:  Group Therapy - Process  Participation Level:  Active  Participation Quality:  Monopolizing and Redirectable  Affect:  Blunted  Cognitive:  Disorganized  Insight:  Improving  Engagement in Therapy:  Improving  Modes of Intervention:  Exploration, Discussion  Charles MantleMareida Grossman-Orr, LCSW 09/21/2014, 12:56 PM
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
## 2014-09-21 NOTE — Progress Notes (Signed)
D: Pt has anxious affect and labile mood.  His speech is pressured, and he has flight of ideas.  Pt is paranoid about medication regimen.  Pt states "y'all confuse the hell out of this brain of mine.  All I need is my higher power.  At home I only take meds in the morning and at night."  Pt reports he hopes to discharge on Monday.  He reports feeling ready for discharge.  Pt denies SI/HI, denies hallucinations, denies pain.  He is visible in the milieu and attended evening group. A: Medications reviewed with pt.  Met with pt 1:1.  Encouraged and supported pt.  Safety maintained.   R: Pt is compliant with medications.  He verbally contracts for safety.  Will continue to monitor and assess for safety.

## 2014-09-21 NOTE — Progress Notes (Signed)
Adult Psychoeducational Group Note  Date:  09/21/2014 Time:  9:11 PM  Group Topic/Focus:  Wrap-Up Group:   The focus of this group is to help patients review their daily goal of treatment and discuss progress on daily workbooks.  Participation Level:  Active  Participation Quality:  Appropriate  Affect:  Appropriate  Cognitive:  Appropriate  Insight: Appropriate  Engagement in Group:  Engaged  Modes of Intervention:  Discussion  Additional Comments:  The patient expressed that overall he had a great day.The patient also said he attend the group about weather.  Octavio Mannshigpen, Layton Tappan Lee 09/21/2014, 9:11 PM

## 2014-09-21 NOTE — Progress Notes (Signed)
D) Pt has been attending the program and interacting with his peers. Affect is flat and mood depressed. Pt has some flight of ideas but comes around to the point at hand. Was agitated this morning when Pt was talking about some medication that he was offered last night. 1:1 provided to Pt where he was able to verbalize his feelings, concerns and anger and was able to put it behind him and move forward with his day. A) Pt given praise and encouragement to verbalize his feelings. A safe environment was set for Pt to be able to express his concerns.  R) Pt was able to verbalize his concerns and move on with his day. Denies SI and HI. Denies hallucinations and delusions. Admits to paranoid thinking.

## 2014-09-22 LAB — VALPROIC ACID LEVEL: Valproic Acid Lvl: 43.9 ug/mL — ABNORMAL LOW (ref 50.0–100.0)
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
## 2014-09-22 NOTE — Progress Notes (Signed)
Adult Psychoeducational Group Note  Date:  09/22/2014 Time:  9:08 PM  Group Topic/Focus:  Wrap-Up Group:   The focus of this group is to help patients review their daily goal of treatment and discuss progress on daily workbooks.  Participation Level:  Active  Participation Quality:  Appropriate  Affect:  Appropriate  Cognitive:  Appropriate  Insight: Appropriate  Engagement in Group:  Engaged  Modes of Intervention:  Discussion  Additional Comments: The patient expressed that his healthy support system is his family and God.The patient also said that being alive was his blessing for today.  Charles Cortez, Charles Cortez 09/22/2014, 9:08 PM

## 2014-09-22 NOTE — [facility] Group Notes (Signed)
[facility] Group Notes:  (Clinical Social Work)  09/22/2014   11:15am-12:00pm  Summary of Progress/Problems:  The main focus of today's process group was to listen to a variety of genres of music and to identify that different types of music provoke different responses.  The patient then was able to identify personally what was soothing for them, as well as energizing.  Handouts were used to record feelings evoked, as well as how patient can personally use this knowledge in sleep habits, with depression, and with other symptoms.  The patient expressed understanding of concepts, as well as knowledge of how each type of music affected him/her and how this can be used at home as a wellness/recovery tool.  He was very attentive and questioned a lot, talked at length about his memories about certain songs.  Type of Therapy:  Music Therapy   Participation Level:  Active  Participation Quality:  Attentive and Sharing  Affect:  Blunted  Cognitive:  Oriented  Insight:  Engaged  Engagement in Therapy:  Engaged  Modes of Intervention:   Activity, Exploration  Charles MantleMareida Grossman-Orr, LCSW 09/22/2014, 12:30pm

## 2014-09-22 NOTE — Progress Notes (Signed)
D) Pt pleasant this morning. Appropriately asking about his mediations. Less flight of ideas and eye contact is improving. Pt is staying a good part of his time in his room, when not in group. Does not like the fact that he is being checked on every 15 minutes but is appropriate in how he handles his response to it. Pt was pleasant at the medication window this morning and is taking his medications as scheduled. A) Given support and reassurance. Frequent short contacts with Pt throughout the shift. Provided with a 1:1. Encouragement provided. Active listening given. R) Pt is improving, less hyper verbal and calmer overall.

## 2014-09-22 NOTE — Plan of Care (Signed)
Problem: Diagnosis: Increased Risk For Suicide Attempt Goal: STG-Patient Will Comply With Medication Regime Outcome: Progressing Pt has been compliant with medications this shift with encouragement from staff.

## 2014-09-23 DIAGNOSIS — F200 Paranoid schizophrenia: Secondary | ICD-10-CM | POA: Insufficient documentation

## 2014-09-23 MED ORDER — DIVALPROEX SODIUM ER 250 MG PO TB24
1250.0000 mg | ORAL_TABLET | Freq: Every day | ORAL | Status: DC
  Filled 2014-09-23: qty 5
  Filled 2014-09-23: qty 15

## 2014-09-23 MED ORDER — DIVALPROEX SODIUM ER 250 MG PO TB24: 1250.0000 mg | ORAL_TABLET | Freq: Every day | ORAL | Status: DC

## 2014-09-23 MED ORDER — RISPERIDONE MICROSPHERES 25 MG IM SUSR
25.0000 mg | INTRAMUSCULAR | Status: DC
  Administered 2014-09-23: 25 mg via INTRAMUSCULAR
  Filled 2014-09-23: qty 2

## 2014-09-23 MED ORDER — TRAZODONE HCL 100 MG PO TABS: 100.0000 mg | ORAL_TABLET | Freq: Every day | ORAL | Status: DC

## 2014-09-23 MED ORDER — BENZTROPINE MESYLATE 1 MG PO TABS: 1.0000 mg | ORAL_TABLET | Freq: Two times a day (BID) | ORAL | Status: DC

## 2014-09-23 MED ORDER — LOSARTAN POTASSIUM-HCTZ 50-12.5 MG PO TABS: 1.0000 | ORAL_TABLET | Freq: Every day | ORAL | Status: DC

## 2014-09-23 MED ORDER — RISPERIDONE 4 MG PO TABS: 4.0000 mg | ORAL_TABLET | Freq: Every evening | ORAL | Status: DC

## 2014-09-23 MED ORDER — RISPERIDONE 2 MG PO TABS: 2.0000 mg | ORAL_TABLET | Freq: Every day | ORAL | Status: DC

## 2014-09-23 MED ORDER — RISPERIDONE MICROSPHERES 25 MG IM SUSR: 25.0000 mg | INTRAMUSCULAR | Status: DC

## 2014-09-23 NOTE — [facility] Suicide Risk Assessment (Addendum)
[facility]   Demographic Factors:  Male  Total Time spent with patient: 30 minutes  Musculoskeletal: Strength & Muscle Tone: within normal limits Gait & Station: normal Patient leans: N/Cortez  Psychiatric Specialty Exam: Physical Exam  Review of Systems  Psychiatric/Behavioral: Negative for depression, suicidal ideas and hallucinations.    Blood pressure 140/74, pulse 96, temperature 97.2 F (36.2 C), temperature source Oral, resp. rate 20, height 5\' 9"  (1.753 m), weight 90.719 kg (200 lb).Body mass index is 29.52 kg/(m^2).  General Appearance: Casual  Eye Contact::  Fair  Speech:  Clear and Coherent409  Volume:  Normal  Mood:  Euthymic  Affect:  Appropriate  Thought Process:  Coherent  Orientation:  Full (Time, Place, and Person)  Thought Content:  WDL  Suicidal Thoughts:  No  Homicidal Thoughts:  No  Memory:  Immediate;   Fair Recent;   Fair Remote;   Fair  Judgement:  Fair  Insight:  Fair  Psychomotor Activity:  Normal  Concentration:  Fair  Recall:  [employer]Fair  Fund of Knowledge:Fair  Language: Fair  Akathisia:  No  Handed:  Right  AIMS (if indicated):     Assets:  Communication Skills Desire for Improvement  Sleep:  Number of Hours: 3.75  Cognition: WNL  ADL's:  Intact   Have you used any form of tobacco in the last 30 days? (Cigarettes, Smokeless Tobacco, Cigars, and/or Pipes): Yes  Has this patient used any form of tobacco in the last 30 days? (Cigarettes, Smokeless Tobacco, Cigars, and/or Pipes) Yes , declined -not ready to quit  Mental Status Per Nursing Assessment::   On Admission:     Current Mental Status by Physician: Patient at baseline is Cortez poor historian. However has been denying any more psychotic episodes after admission here. Patient is compliant on medications. Patient denies SI/HI/AH/VH.  Loss Factors: NA  Historical Factors: Impulsivity  Risk Reduction Factors:   Positive social support  Continued Clinical  Symptoms:  Previous Psychiatric Diagnoses and Treatments Medical Diagnoses and Treatments/Surgeries  Cognitive Features That Contribute To Risk:  None    Suicide Risk:  Minimal: No identifiable suicidal ideation.  Patients presenting with no risk factors but with morbid ruminations; may be classified as minimal risk based on the severity of the depressive symptoms  Principal Problem: Schizophrenia Discharge Diagnoses:  Patient Active Problem List   Diagnosis Date Noted  . Schizophrenia [F20.9] 09/18/2014  . Elevated CPK [R74.8] 09/17/2014  . HTN (hypertension) [I10] 09/17/2014    Follow-up Information    Follow up with Charles GermanAVBUERE,Charles A, MD On 11/08/2014.   Specialty:  Internal Medicine   Why:  Sister has made appointment for patient w PCP.    Contact information:   [address] [place] [address] [phone]       [facility]   Specialty:  Behavioral Health   Why:  Patient will be followed at hospital discharge and can use [facility] Mon - Fri from 8 AM- 3 PM if needed, has current appt on 11/04/14 at 3:00 PM   Contact information:   [address] [place] [address] [phone]       Plan Of Care/Follow-up recommendations:  Activity:  No restrictions Diet:  regular Tests:  as needed Other:  Patient to be discharged on Risperdal Consta 25 mg IM Q14 DAYS. PPD test -negative  Is patient on multiple antipsychotic therapies at discharge:  No   Has Patient had three or more failed  trials of antipsychotic monotherapy by history:  No  Recommended Plan for Multiple Antipsychotic Therapies: NA    Charles Cortez 09/23/2014, 9:41 AM

## 2014-09-23 NOTE — [facility] Suicide Risk Assessment (Signed)
[facility] INPATIENT:  Family/Significant Other Suicide Prevention Education  Suicide Prevention Education:  Education Completed; PrintmakerCassandra Wainright, sister,,  (name of family member/significant other) has been identified by the patient as the family member/significant other with whom the patient will be residing, and identified as the person(s) who will aid the patient in the event of a mental health crisis (suicidal ideations/suicide attempt).  With written consent from the patient, the family member/significant other has been provided the following suicide prevention education, prior to the and/or following the discharge of the patient.  The suicide prevention education provided includes the following:  Suicide risk factors  Suicide prevention and interventions  National Suicide Hotline telephone number  [facility] assessment telephone number  [facility] Emergency Assistance 911  [facility] and/or Residential Mobile Crisis Unit telephone number  Request made of family/significant other to:  Remove weapons (e.g., guns, rifles, knives), all items previously/currently identified as safety concern.    Remove drugs/medications (over-the-counter, prescriptions, illicit drugs), all items previously/currently identified as a safety concern.  The family member/significant other verbalizes understanding of the suicide prevention education information provided.  The family member/significant other agrees to remove the items of safety concern listed above.  Pamphlet provided as well as additional family support/caregiver support information on local community resources.  Sallee LangeCunningham, Anne C 09/23/2014, 10:23 AM

## 2014-09-23 NOTE — Progress Notes (Signed)
Pt alert and oriented. Pt calm and cooperative. Pt took all meds prescribed and was compliant with treatment plan. Pts sister came to pick him up at discharge. No meds were on Pt chart at the time of discharge and Pts sister was in a rush to leave as she had to go to work. Meds found on chart about an hour after Pt discharged. Pt sister will be contacted, otherwise medications will be sent back to pharmacy. Pt received all of prescriptions and paperwork. Pts belongings were sent home with his sister prior to discharge. Pt expressed no current concerns and denied and SI/HI/AH/VH. Pt received injection good for 14 days. Pt has discharged.
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
## 2014-09-23 NOTE — Progress Notes (Addendum)
  [facility] Discharge [facility] :  Will you be returning to the same living situation after discharge:  No.[facility], [address], [place], [place]  [address]  [phone] (facility phone) At discharge, do you have transportation home?: Yes,  sister will transport Do you have the ability to pay for your medications: Yes,  has both Medic[employer] and Medicaid, will be working w [facility]  Release of information consent forms completed and turned in to Medical Records.  Patient to Follow up at: Follow-up Information    Follow up with Dorrene GermanAVBUERE,EDWIN A, MD On 11/08/2014.   Specialty:  Internal Medicine   Why:  Sister has made appointment for patient w PCP.    Contact information:   [address] [place] [address] [phone]       [facility] with [facility] On 11/04/2014.   Specialty:  Behavioral Health   Why:  Patient will be followed at hospital discharge and can use [facility] Mon - Fri from 8 AM- 3 PM if needed, has current appt on 11/04/14 at 3:00 PM   Contact information:   [address] [place] [address] [phone]       Patient denies SI/HI: Yes,  per RN report    Safety Planning and Suicide Prevention discussed: Yes,  pamphlet provided and discussed w sister, discharging to facility  Have you used any form of tobacco in the last 30 days? (Cigarettes, Smokeless Tobacco, Cigars, and/or Pipes): Yes  Has patient been referred to the Quitline?: Patient refused referral   Patient has been offered referral to [facility], Vikki PortsValerie advised that patient is being discharged on injectable and will need follow up appt to ensure continuity of care for patient.  Cleotilde NeerM Fox at [facility] aware that patient will admit today.    Sallee LangeCunningham, Anne C 09/23/2014, 10:10 AM

## 2014-09-23 NOTE — Clinical Social Work Note (Signed)
VM from sister stating she can pick up patient approx 12 Noon today.  Rn and MD informed.  [facility] aware patient will admit today.  FL2 prepared and placed in discharge packet.  Sister given resources for family support including SPE pamphlet, [facility] Caregiver support programs and [facility] Mobile Crisis Management information.  Santa GeneraAnne Jaedin Trumbo, LCSW Clinical Social Worker

## 2014-09-23 NOTE — Progress Notes (Signed)
Patient ID: Charles Cortez R Dascoli Jr., male   DOB: 07/14/1966, 49 y.o.   MRN: [id number]   D: When asked about his stay, pt informed the writer of his past. 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 Stated he worked like that for 3 yrs and the habit is very hard to break. Pt stated, "It's hard to turn off". Stated that even though he doesn't have the job anymore "he keeps thinking about it and all the things he needs to do". Stated though he realizes he has nothing to do. Pt states it makes it hard for him to rest at night.   A:  Support and encouragement was offered. 15 min checks continued for safety.  R: Pt remains safe.

## 2014-09-26 ENCOUNTER — Ambulatory Visit (INDEPENDENT_AMBULATORY_CARE_PROVIDER_SITE_OTHER): Payer: Medicare Other | Admitting: Family Medicine

## 2014-09-26 VITALS — BP 128/58 | HR 102 | Temp 97.5°F | Resp 16 | Ht 69.0 in | Wt 193.4 lb

## 2014-09-26 DIAGNOSIS — Z91018 Allergy to other foods: Secondary | ICD-10-CM

## 2014-09-26 MED ORDER — EPINEPHRINE 0.3 MG/0.3ML IJ SOAJ: 0.3000 mg | Freq: Once | INTRAMUSCULAR | Status: DC

## 2014-09-26 NOTE — Progress Notes (Signed)
Patient Discharge Instructions:  After Visit Summary (AVS):   Faxed to:  09/26/14 Discharge Summary Note:   Faxed to:  09/26/14 Psychiatric Admission Assessment Note:   Faxed to:  09/26/14 Suicide Risk Assessment - Discharge Assessment:   Faxed to:  09/26/14 Faxed/Sent to the Next Level Care provider:  09/26/14 Faxed to [facility] @ [phone] Faxed to [facility] @ [phone]  Jerelene ReddenSheena E Fleischmanns, 09/26/2014, 2:51 PM

## 2014-09-26 NOTE — Patient Instructions (Signed)
Epipen was provided if you are having signs or symptoms of allergic reaction (such as difficulty breathing, tightness or swelling feeling in mouth or throat, hives).  If you do require this medicine, inject into thigh once and call 911 or proceed directly to an emergency room. You can discuss these allergies further with your primary care provider, or possibly allergist for testing if needed. Return to the clinic or go to the nearest emergency room if any of your symptoms worsen or new symptoms occur.

## 2014-09-26 NOTE — Progress Notes (Signed)
Subjective:    Patient ID: Charles Cortez., male    DOB: 1965-09-14, 49 y.o.   MRN: [id number] This chart was scribed for Meredith Staggers, MD by Littie Deeds, Medical Scribe. This patient was seen in Room 9 and the patient's care was started at 10:45 AM.   HPI HPI Comments: Charles Ponciano. is a 49 y.o. male with a hx of food allergies, HTN and paranoid schizophrenia who presents to the [facility] requesting an EpiPen. Patient lives at [facility]; the staff there is concerned about his allergies and prompted him to obtain an EpiPen. He has multiple allergies, including peanut, fish, pork, soy sauce, ketchup, and mustard. He starts sweating with pork and becomes irrational/acts strange. Patient notes he gets a "weird feeling" with peanuts and has been hospitalized for this in the past. He has also had hives, swelling of the throat, and SOB as a reaction in the past. Patient has been evaluated by his PCP for his allergies. He states his reactions are not related to his schizophrenia and feel different than schizophrenia.   Patient also reports having some neck soreness for the past 2 weeks.  PCP: Dr. Concepcion Elk  Patient Active Problem List   Diagnosis Date Noted  . Paranoid schizophrenia   . Schizophrenia 09/18/2014  . Elevated CPK 09/17/2014  . HTN (hypertension) 09/17/2014   Past Medical History  Diagnosis Date  . Paranoid schizophrenia   . Bipolar affective disorder   . Depression   . GERD (gastroesophageal reflux disease)   . Hypertension    Past Surgical History  Procedure Laterality Date  . Knee surgery    . Finger surgery     Allergies  Allergen Reactions  . Fish-Derived Products   . Arlice Colt Isothiocyanate]   . Other      ketchup, soy sauce, peanuts  . Peanut-Containing Drug Products   . Pork-Derived Products Swelling   Prior to Admission medications   Medication Sig Start Date End Date Taking? Authorizing Provider  benztropine  (COGENTIN) 1 MG tablet Take 1 tablet (1 mg total) by mouth 2 (two) times daily. 09/23/14  Yes Adonis Brook, NP  divalproex (DEPAKOTE ER) 250 MG 24 hr tablet Take 5 tablets (1,250 mg total) by mouth at bedtime. 09/23/14  Yes Adonis Brook, NP  losartan-hydrochlorothiazide (HYZAAR) 50-12.5 MG per tablet Take 1 tablet by mouth daily. 09/23/14  Yes Adonis Brook, NP  risperiDONE (RISPERDAL) 2 MG tablet Take 1 tablet (2 mg total) by mouth daily. 09/23/14  Yes Adonis Brook, NP  risperiDONE (RISPERDAL) 4 MG tablet Take 1 tablet (4 mg total) by mouth every evening. 09/23/14  Yes Adonis Brook, NP  risperiDONE microspheres (RISPERDAL CONSTA) 25 MG injection Inject 2 mLs (25 mg total) into the muscle every 14 (fourteen) days. Dose given today.  Next dose due 10/07/2014 09/23/14  Yes Adonis Brook, NP  traZODone (DESYREL) 100 MG tablet Take 1 tablet (100 mg total) by mouth at bedtime. 09/23/14  Yes Adonis Brook, NP   History   Social History  . Marital Status: Single    Spouse Name: N/A  . Number of Children: N/A  . Years of Education: N/A   Occupational History  . Not on file.   Social History Main Topics  . Smoking status: Current Every Day Smoker -- 0.50 packs/day for 30 years  . Smokeless tobacco: Not on file  . Alcohol Use: No  . Drug Use: No  . Sexual Activity: Not  on file   Other Topics Concern  . Not on file   Social History Narrative     Review of Systems  Musculoskeletal: Positive for neck pain.  Allergic/Immunologic: Positive for food allergies.       Objective:   Physical Exam  Constitutional: He is oriented to person, place, and time. He appears well-developed and well-nourished. No distress.  HENT:  Head: Normocephalic and atraumatic.  Mouth/Throat: Oropharynx is clear and moist. No oropharyngeal exudate.  Eyes: Pupils are equal, round, and reactive to light.  Neck: Neck supple.  No lymphadenopathy.  Cardiovascular: Normal rate, regular rhythm and normal heart sounds.     No murmur heard. Pulmonary/Chest: Effort normal and breath sounds normal. No respiratory distress. He has no wheezes. He has no rales.  No stridor.  Musculoskeletal: He exhibits no edema.  Lymphadenopathy:    He has no cervical adenopathy.  Neurological: He is alert and oriented to person, place, and time. No cranial nerve deficit.  Skin: Skin is warm and dry. No rash noted.  Psychiatric: He has a normal mood and affect. His behavior is normal.  Nursing note and vitals reviewed.     Filed Vitals:   09/26/14 0949  BP: 128/58  Pulse: 102  Temp: 97.5 F (36.4 C)  TempSrc: Oral  Resp: 16  Height: 5\' 9"  (1.753 m)  Weight: 193 lb 6.4 oz (87.726 kg)  SpO2: 98%       Assessment & Plan:   Charles Mccoyhomas R Jasek Jr. is a 49 y.o. male Food allergy - Plan: EPINEPHrine 0.3 mg/0.3 mL IJ SOAJ injection  history provided not typical allergy symptoms to food and suspect schizophrenia manifestations with some reported sx's, but possible urticaria or respiratory symptoms as well. Somewhat difficult history.    -epipen rx given and representative for group home present for conversation on appropriate indications for use and necessary aftercare in ER. Recommended allergy testing or further clarifications of allergies with PCP. rtc precautions.   Meds ordered this encounter  Medications  . EPINEPHrine 0.3 mg/0.3 mL IJ SOAJ injection    Sig: Inject 0.3 mLs (0.3 mg total) into the muscle once. If having allergic reaction    Dispense:  1 Device    Refill:  0   Patient Instructions  Epipen was provided if you are having signs or symptoms of allergic reaction (such as difficulty breathing, tightness or swelling feeling in mouth or throat, hives).  If you do require this medicine, inject into thigh once and call 911 or proceed directly to an emergency room. You can discuss these allergies further with your primary care provider, or possibly allergist for testing if needed. Return to the clinic or go to  the nearest emergency room if any of your symptoms worsen or new symptoms occur.     I personally performed the services described in this documentation, which was scribed in my presence. The recorded information has been reviewed and considered, and addended by me as needed.

## 2014-09-27 DIAGNOSIS — F250 Schizoaffective disorder, bipolar type: Secondary | ICD-10-CM | POA: Diagnosis not present

## 2014-09-27 DIAGNOSIS — F29 Unspecified psychosis not due to a substance or known physiological condition: Secondary | ICD-10-CM | POA: Diagnosis not present

## 2014-10-08 DIAGNOSIS — F29 Unspecified psychosis not due to a substance or known physiological condition: Secondary | ICD-10-CM | POA: Diagnosis not present

## 2014-10-08 DIAGNOSIS — F250 Schizoaffective disorder, bipolar type: Secondary | ICD-10-CM | POA: Diagnosis not present

## 2014-10-09 DIAGNOSIS — I10 Essential (primary) hypertension: Secondary | ICD-10-CM | POA: Diagnosis not present

## 2014-10-09 DIAGNOSIS — Z13220 Encounter for screening for lipoid disorders: Secondary | ICD-10-CM | POA: Diagnosis not present

## 2014-10-09 DIAGNOSIS — Z125 Encounter for screening for malignant neoplasm of prostate: Secondary | ICD-10-CM | POA: Diagnosis not present

## 2014-10-16 ENCOUNTER — Ambulatory Visit (INDEPENDENT_AMBULATORY_CARE_PROVIDER_SITE_OTHER): Payer: Medicare Other | Admitting: Family Medicine

## 2014-10-16 VITALS — BP 116/70 | HR 74 | Temp 97.8°F | Resp 18 | Ht 69.5 in | Wt 199.6 lb

## 2014-10-16 DIAGNOSIS — M79671 Pain in right foot: Secondary | ICD-10-CM

## 2014-10-16 DIAGNOSIS — F209 Schizophrenia, unspecified: Secondary | ICD-10-CM | POA: Diagnosis not present

## 2014-10-16 DIAGNOSIS — S90851A Superficial foreign body, right foot, initial encounter: Secondary | ICD-10-CM | POA: Diagnosis not present

## 2014-10-16 NOTE — Progress Notes (Signed)
This a 49 year old gentleman comes in with sore foot. He's had this for several weeks he thinks that he may have stepped on something and gotten a foreign body on the sole of his foot.  He's had no fever or discharge from his foot.  Patient lives in a group home and needs his FL-2 form filled out.  Objective: Patient is oriented, cooperative and appropriate. HEENT: Unremarkable Examination of foot shows a small 2 mm area of sclerosis. This was debrided with a 15 Bard-Parker blade and a small black filament was removed. Following this it appeared to have a wart at the base so the lesion was sprayed with liquid nitrogen for 15 seconds.  After the foot was debrided, FL to forms were reviewed and signed  Assessment: Foreign body in right foot with possible wart on the plantar surface  Plan: Return as necessary  This chart was scribed in my presence and reviewed by me personally.    ICD-9-CM ICD-10-CM   1. Pain in right foot 729.5 M79.671   2. Foreign body in right foot, initial encounter 917.6 S90.851A   3. Schizophrenia, unspecified type 295.90 F20.9      Signed, Elvina SidleKurt Duffy Dantonio, MD

## 2014-10-22 DIAGNOSIS — F250 Schizoaffective disorder, bipolar type: Secondary | ICD-10-CM | POA: Diagnosis not present

## 2014-10-22 DIAGNOSIS — F29 Unspecified psychosis not due to a substance or known physiological condition: Secondary | ICD-10-CM | POA: Diagnosis not present

## 2014-11-04 ENCOUNTER — Telehealth: Payer: Self-pay

## 2014-11-04 ENCOUNTER — Ambulatory Visit (INDEPENDENT_AMBULATORY_CARE_PROVIDER_SITE_OTHER): Payer: Medicare Other | Admitting: Internal Medicine

## 2014-11-04 VITALS — BP 104/80 | HR 72 | Temp 97.7°F | Resp 18 | Ht 69.5 in | Wt 199.0 lb

## 2014-11-04 DIAGNOSIS — F250 Schizoaffective disorder, bipolar type: Secondary | ICD-10-CM | POA: Diagnosis not present

## 2014-11-04 DIAGNOSIS — J200 Acute bronchitis due to Mycoplasma pneumoniae: Secondary | ICD-10-CM

## 2014-11-04 DIAGNOSIS — F29 Unspecified psychosis not due to a substance or known physiological condition: Secondary | ICD-10-CM | POA: Diagnosis not present

## 2014-11-04 MED ORDER — HYDROCODONE-ACETAMINOPHEN 7.5-325 MG/15ML PO SOLN: 10.0000 mL | Freq: Four times a day (QID) | ORAL | Status: DC | PRN

## 2014-11-04 MED ORDER — SALINE NASAL SPRAY 0.65 % NA SOLN: 1.0000 | NASAL | Status: DC | PRN

## 2014-11-04 MED ORDER — AZITHROMYCIN 250 MG PO TABS: ORAL_TABLET | ORAL | Status: DC

## 2014-11-04 NOTE — Telephone Encounter (Signed)
Patient is in HOME CARE and therefore a script has to be presented for saline nasal spray.   Please write and fax to [facility] [phone]

## 2014-11-04 NOTE — Patient Instructions (Signed)
Acute Bronchitis Bronchitis is inflammation of the airways that extend from the windpipe into the lungs (bronchi). The inflammation often causes mucus to develop. This leads to a cough, which is the most common symptom of bronchitis.  In acute bronchitis, the condition usually develops suddenly and goes away over time, usually in a couple weeks. Smoking, allergies, and asthma can make bronchitis worse. Repeated episodes of bronchitis may cause further lung problems.  CAUSES Acute bronchitis is most often caused by the same virus that causes a cold. The virus can spread from person to person (contagious) through coughing, sneezing, and touching contaminated objects. SIGNS AND SYMPTOMS   Cough.   Fever.   Coughing up mucus.   Body aches.   Chest congestion.   Chills.   Shortness of breath.   Sore throat.  DIAGNOSIS  Acute bronchitis is usually diagnosed through a physical exam. Your health care provider will also ask you questions about your medical history. Tests, such as chest X-rays, are sometimes done to rule out other conditions.  TREATMENT  Acute bronchitis usually goes away in a couple weeks. Oftentimes, no medical treatment is necessary. Medicines are sometimes given for relief of fever or cough. Antibiotic medicines are usually not needed but may be prescribed in certain situations. In some cases, an inhaler may be recommended to help reduce shortness of breath and control the cough. A cool mist vaporizer may also be used to help thin bronchial secretions and make it easier to clear the chest.  HOME CARE INSTRUCTIONS  Get plenty of rest.   Drink enough fluids to keep your urine clear or pale yellow (unless you have a medical condition that requires fluid restriction). Increasing fluids may help thin your respiratory secretions (sputum) and reduce chest congestion, and it will prevent dehydration.   Take medicines only as directed by your health care provider.  If  you were prescribed an antibiotic medicine, finish it all even if you start to feel better.  Avoid smoking and secondhand smoke. Exposure to cigarette smoke or irritating chemicals will make bronchitis worse. If you are a smoker, consider using nicotine gum or skin patches to help control withdrawal symptoms. Quitting smoking will help your lungs heal faster.   Reduce the chances of another bout of acute bronchitis by washing your hands frequently, avoiding people with cold symptoms, and trying not to touch your hands to your mouth, nose, or eyes.   Keep all follow-up visits as directed by your health care provider.  SEEK MEDICAL CARE IF: Your symptoms do not improve after 1 week of treatment.  SEEK IMMEDIATE MEDICAL CARE IF:  You develop an increased fever or chills.   You have chest pain.   You have severe shortness of breath.  You have bloody sputum.   You develop dehydration.  You faint or repeatedly feel like you are going to pass out.  You develop repeated vomiting.  You develop a severe headache. MAKE SURE YOU:   Understand these instructions.  Will watch your condition.  Will get help right away if you are not doing well or get worse. Document Released: 08/12/2004 Document Revised: 11/19/2013 Document Reviewed: 12/26/2012 [facility] Patient Information 2015 [place], [place]. This information is not intended to replace advice given to you by your health care provider. Make sure you discuss any questions you have with your health care provider. Smoking Cessation Quitting smoking is important to your health and has many advantages. However, it is not always easy to quit since nicotine is  a very addictive drug. Oftentimes, people try 3 times or more before being able to quit. This document explains the best ways for you to prepare to quit smoking. Quitting takes hard work and a lot of effort, but you can do it. ADVANTAGES OF QUITTING SMOKING  You will live longer, feel  better, and live better.  Your body will feel the impact of quitting smoking almost immediately.  Within 20 minutes, blood pressure decreases. Your pulse returns to its normal level.  After 8 hours, carbon monoxide levels in the blood return to normal. Your oxygen level increases.  After 24 hours, the chance of having a heart attack starts to decrease. Your breath, hair, and body stop smelling like smoke.  After 48 hours, damaged nerve endings begin to recover. Your sense of taste and smell improve.  After 72 hours, the body is virtually free of nicotine. Your bronchial tubes relax and breathing becomes easier.  After 2 to 12 weeks, lungs can hold more air. Exercise becomes easier and circulation improves.  The risk of having a heart attack, stroke, cancer, or lung disease is greatly reduced.  After 1 year, the risk of coronary heart disease is cut in half.  After 5 years, the risk of stroke falls to the same as a nonsmoker.  After 10 years, the risk of lung cancer is cut in half and the risk of other cancers decreases significantly.  After 15 years, the risk of coronary heart disease drops, usually to the level of a nonsmoker.  If you are pregnant, quitting smoking will improve your chances of having a healthy baby.  The people you live with, especially any children, will be healthier.  You will have extra money to spend on things other than cigarettes. QUESTIONS TO THINK ABOUT BEFORE ATTEMPTING TO QUIT You may want to talk about your answers with your health care provider.  Why do you want to quit?  If you tried to quit in the past, what helped and what did not?  What will be the most difficult situations for you after you quit? How will you plan to handle them?  Who can help you through the tough times? Your family? Friends? A health care provider?  What pleasures do you get from smoking? What ways can you still get pleasure if you quit? Here are some questions to ask  your health care provider:  How can you help me to be successful at quitting?  What medicine do you think would be best for me and how should I take it?  What should I do if I need more help?  What is smoking withdrawal like? How can I get information on withdrawal? GET READY  Set a quit date.  Change your environment by getting rid of all cigarettes, ashtrays, matches, and lighters in your home, car, or work. Do not let people smoke in your home.  Review your past attempts to quit. Think about what worked and what did not. GET SUPPORT AND ENCOURAGEMENT You have a better chance of being successful if you have help. You can get support in many ways.  Tell your family, friends, and coworkers that you are going to quit and need their support. Ask them not to smoke around you.  Get individual, group, or telephone counseling and support. Programs are available at local hospitals and health centers. Call your local health department for information about programs in your area.  Spiritual beliefs and practices may help some smokers quit.  Download   a "quit meter" on your computer to keep track of quit statistics, such as how long you have gone without smoking, cigarettes not smoked, and money saved.  Get a self-help book about quitting smoking and staying off tobacco. LEARN NEW SKILLS AND BEHAVIORS  Distract yourself from urges to smoke. Talk to someone, go for a walk, or occupy your time with a task.  Change your normal routine. Take a different route to work. Drink tea instead of coffee. Eat breakfast in a different place.  Reduce your stress. Take a hot bath, exercise, or read a book.  Plan something enjoyable to do every day. Reward yourself for not smoking.  Explore interactive web-based programs that specialize in helping you quit. GET MEDICINE AND USE IT CORRECTLY Medicines can help you stop smoking and decrease the urge to smoke. Combining medicine with the above behavioral  methods and support can greatly increase your chances of successfully quitting smoking.  Nicotine replacement therapy helps deliver nicotine to your body without the negative effects and risks of smoking. Nicotine replacement therapy includes nicotine gum, lozenges, inhalers, nasal sprays, and skin patches. Some may be available over-the-counter and others require a prescription.  Antidepressant medicine helps people abstain from smoking, but how this works is unknown. This medicine is available by prescription.  Nicotinic receptor partial agonist medicine simulates the effect of nicotine in your brain. This medicine is available by prescription. Ask your health care provider for advice about which medicines to use and how to use them based on your health history. Your health care provider will tell you what side effects to look out for if you choose to be on a medicine or therapy. Carefully read the information on the package. Do not use any other product containing nicotine while using a nicotine replacement product.  RELAPSE OR DIFFICULT SITUATIONS Most relapses occur within the first 3 months after quitting. Do not be discouraged if you start smoking again. Remember, most people try several times before finally quitting. You may have symptoms of withdrawal because your body is used to nicotine. You may crave cigarettes, be irritable, feel very hungry, cough often, get headaches, or have difficulty concentrating. The withdrawal symptoms are only temporary. They are strongest when you first quit, but they will go away within 10-14 days. To reduce the chances of relapse, try to:  Avoid drinking alcohol. Drinking lowers your chances of successfully quitting.  Reduce the amount of caffeine you consume. Once you quit smoking, the amount of caffeine in your body increases and can give you symptoms, such as a rapid heartbeat, sweating, and anxiety.  Avoid smokers because they can make you want to  smoke.  Do not let weight gain distract you. Many smokers will gain weight when they quit, usually less than 10 pounds. Eat a healthy diet and stay active. You can always lose the weight gained after you quit.  Find ways to improve your mood other than smoking. FOR MORE INFORMATION  www.smokefree.gov  Document Released: 06/29/2001 Document Revised: 11/19/2013 Document Reviewed: 10/14/2011 [facility] Patient Information 2015 [place]. This information is not intended to replace advice given to you by your health care provider. Make sure you discuss any questions you have with your health care provider. Cough, Adult  A cough is a reflex that helps clear your throat and airways. It can help heal the body or may be a reaction to an irritated airway. A cough may only last 2 or 3 weeks (acute) or may last more than  8 weeks (chronic).  CAUSES Acute cough:  Viral or bacterial infections. Chronic cough:  Infections.  Allergies.  Asthma.  Post-nasal drip.  Smoking.  Heartburn or acid reflux.  Some medicines.  Chronic lung problems (COPD).  Cancer. SYMPTOMS   Cough.  Fever.  Chest pain.  Increased breathing rate.  High-pitched whistling sound when breathing (wheezing).  Colored mucus that you cough up (sputum). TREATMENT   A bacterial cough may be treated with antibiotic medicine.  A viral cough must run its course and will not respond to antibiotics.  Your caregiver may recommend other treatments if you have a chronic cough. HOME CARE INSTRUCTIONS   Only take over-the-counter or prescription medicines for pain, discomfort, or fever as directed by your caregiver. Use cough suppressants only as directed by your caregiver.  Use a cold steam vaporizer or humidifier in your bedroom or home to help loosen secretions.  Sleep in a semi-upright position if your cough is worse at night.  Rest as needed.  Stop smoking if you smoke. SEEK IMMEDIATE MEDICAL CARE IF:    You have pus in your sputum.  Your cough starts to worsen.  You cannot control your cough with suppressants and are losing sleep.  You begin coughing up blood.  You have difficulty breathing.  You develop pain which is getting worse or is uncontrolled with medicine.  You have a fever. MAKE SURE YOU:   Understand these instructions.  Will watch your condition.  Will get help right away if you are not doing well or get worse. Document Released: 01/01/2011 Document Revised: 09/27/2011 Document Reviewed: 01/01/2011 [facility] Patient Information 2015 [place], [place]. This information is not intended to replace advice given to you by your health care provider. Make sure you discuss any questions you have with your health care provider.

## 2014-11-04 NOTE — Progress Notes (Signed)
   Subjective:    Patient ID: Charles Cortez R Landau Jr., male    DOB: 08/20/1965, 49 y.o.   MRN: [id number]  HPI  49 year old male  [facility] Complains of cough/congestion Smokes 1/2 pack of cigarettes a day  No chest pain    Review of Systems scizophrenia on multiple meds    Objective:   Physical Exam  Constitutional: He is oriented to person, place, and time. He appears well-developed and well-nourished. No distress.  HENT:  Head: Normocephalic.  Right Ear: External ear normal.  Nose: Mucosal edema and rhinorrhea present. No sinus tenderness.  Mouth/Throat: Uvula is midline and mucous membranes are normal. Posterior oropharyngeal erythema present.  Eyes: Conjunctivae and EOM are normal. Pupils are equal, round, and reactive to light.  Neck: Normal range of motion. Neck supple.  Cardiovascular: Normal rate, regular rhythm and normal heart sounds.   Pulmonary/Chest: Effort normal. No tachypnea. No respiratory distress. He has decreased breath sounds. He has no wheezes. He has rhonchi. He has no rales. He exhibits no tenderness.  Musculoskeletal: Normal range of motion.  Lymphadenopathy:    He has no cervical adenopathy.  Neurological: He is alert and oriented to person, place, and time. He exhibits normal muscle tone. Coordination normal.  Psychiatric: He has a normal mood and affect.          Assessment & Plan:  Bronchitis/cough Zpak/lortab

## 2014-11-04 NOTE — Telephone Encounter (Signed)
Meds ordered this encounter  Medications  . sodium chloride (OCEAN) 0.65 % nasal spray    Sig: Place 1 spray into the nose as needed for congestion.    Dispense:  30 mL    Refill:  12    Order Specific Question:  Supervising Provider    Answer:  Merla RichesOLITTLE, ROBERT P [[id number]]

## 2014-11-05 DIAGNOSIS — F250 Schizoaffective disorder, bipolar type: Secondary | ICD-10-CM | POA: Diagnosis not present

## 2014-11-05 DIAGNOSIS — F29 Unspecified psychosis not due to a substance or known physiological condition: Secondary | ICD-10-CM | POA: Diagnosis not present

## 2014-11-19 DIAGNOSIS — N485 Ulcer of penis: Secondary | ICD-10-CM | POA: Diagnosis not present

## 2014-11-19 DIAGNOSIS — Z111 Encounter for screening for respiratory tuberculosis: Secondary | ICD-10-CM | POA: Diagnosis not present

## 2014-11-19 DIAGNOSIS — F2089 Other schizophrenia: Secondary | ICD-10-CM | POA: Diagnosis not present

## 2014-11-19 DIAGNOSIS — I10 Essential (primary) hypertension: Secondary | ICD-10-CM | POA: Diagnosis not present

## 2014-12-24 DIAGNOSIS — F2089 Other schizophrenia: Secondary | ICD-10-CM | POA: Diagnosis not present

## 2014-12-24 DIAGNOSIS — F17210 Nicotine dependence, cigarettes, uncomplicated: Secondary | ICD-10-CM | POA: Diagnosis not present

## 2014-12-24 DIAGNOSIS — I10 Essential (primary) hypertension: Secondary | ICD-10-CM | POA: Diagnosis not present

## 2014-12-24 DIAGNOSIS — S6981XA Other specified injuries of right wrist, hand and finger(s), initial encounter: Secondary | ICD-10-CM | POA: Diagnosis not present

## 2014-12-24 DIAGNOSIS — J069 Acute upper respiratory infection, unspecified: Secondary | ICD-10-CM | POA: Diagnosis not present

## 2015-01-09 DIAGNOSIS — I10 Essential (primary) hypertension: Secondary | ICD-10-CM | POA: Diagnosis not present

## 2015-01-09 DIAGNOSIS — F2089 Other schizophrenia: Secondary | ICD-10-CM | POA: Diagnosis not present

## 2015-01-09 DIAGNOSIS — F17210 Nicotine dependence, cigarettes, uncomplicated: Secondary | ICD-10-CM | POA: Diagnosis not present

## 2015-01-09 DIAGNOSIS — M5408 Panniculitis affecting regions of neck and back, sacral and sacrococcygeal region: Secondary | ICD-10-CM | POA: Diagnosis not present

## 2015-04-29 DIAGNOSIS — L259 Unspecified contact dermatitis, unspecified cause: Secondary | ICD-10-CM | POA: Diagnosis not present

## 2015-04-29 DIAGNOSIS — F2089 Other schizophrenia: Secondary | ICD-10-CM | POA: Diagnosis not present

## 2015-04-29 DIAGNOSIS — F17210 Nicotine dependence, cigarettes, uncomplicated: Secondary | ICD-10-CM | POA: Diagnosis not present

## 2015-04-29 DIAGNOSIS — I10 Essential (primary) hypertension: Secondary | ICD-10-CM | POA: Diagnosis not present

## 2020-08-06 ENCOUNTER — Other Ambulatory Visit: Payer: Self-pay | Admitting: Internal Medicine

## 2020-08-07 LAB — COMPLETE METABOLIC PANEL WITH GFR
AG Ratio: 1.8 (calc) (ref 1.0–2.5)
ALT: 39 U/L (ref 9–46)
AST: 34 U/L (ref 10–35)
Albumin: 4.3 g/dL (ref 3.6–5.1)
Alkaline phosphatase (APISO): 81 U/L (ref 35–144)
BUN: 15 mg/dL (ref 7–25)
CO2: 28 mmol/L (ref 20–32)
Calcium: 10 mg/dL (ref 8.6–10.3)
Chloride: 102 mmol/L (ref 98–110)
Creat: 0.85 mg/dL (ref 0.70–1.33)
GFR, Est African American: 115 mL/min/{1.73_m2} (ref >=60)
GFR, Est Non African American: 99 mL/min/{1.73_m2} (ref >=60)
Globulin: 2.4 g/dL (calc) (ref 1.9–3.7)
Glucose, Bld: 88 mg/dL (ref 65–99)
Potassium: 4.4 mmol/L (ref 3.5–5.3)
Sodium: 137 mmol/L (ref 135–146)
Total Bilirubin: 0.2 mg/dL (ref 0.2–1.2)
Total Protein: 6.7 g/dL (ref 6.1–8.1)

## 2020-08-07 LAB — CBC
HCT: 40.1 % (ref 38.5–50.0)
Hemoglobin: 13.5 g/dL (ref 13.2–17.1)
MCH: 28.1 pg (ref 27.0–33.0)
MCHC: 33.7 g/dL (ref 32.0–36.0)
MCV: 83.5 fL (ref 80.0–100.0)
MPV: 10.4 fL (ref 7.5–12.5)
Platelets: 244 10*3/uL (ref 140–400)
RBC: 4.8 10*6/uL (ref 4.20–5.80)
RDW: 11.3 % (ref 11.0–15.0)
WBC: 3.2 10*3/uL — ABNORMAL LOW (ref 3.8–10.8)

## 2020-08-07 LAB — LIPID PANEL
Cholesterol: 132 mg/dL (ref <200)
HDL: 59 mg/dL (ref >=40)
LDL Cholesterol (Calc): 64 mg/dL (calc)
Non-HDL Cholesterol (Calc): 73 mg/dL (calc) (ref <130)
Total CHOL/HDL Ratio: 2.2 (calc) (ref <5.0)
Triglycerides: 30 mg/dL (ref <150)

## 2020-08-07 LAB — PSA: PSA: 0.84 ng/mL (ref <=4.0)

## 2020-08-07 LAB — TSH: TSH: 1.42 mIU/L (ref 0.40–4.50)

## 2020-08-07 LAB — SARS-COV-2 RNA,(COVID-19) QUALITATIVE NAAT: SARS CoV2 RNA: DETECTED — CR

## 2020-09-11 ENCOUNTER — Emergency Department (HOSPITAL_COMMUNITY): Payer: Medicare Other

## 2020-09-11 ENCOUNTER — Encounter (HOSPITAL_COMMUNITY): Payer: Self-pay | Admitting: Radiology

## 2020-09-11 ENCOUNTER — Emergency Department (HOSPITAL_COMMUNITY)
Admission: EM | Admit: 2020-09-11 | Discharge: 2020-09-11 | Disposition: A | Discharge status: Home / Self Care | Payer: Medicare Other | Attending: Emergency Medicine | Admitting: Emergency Medicine

## 2020-09-11 ENCOUNTER — Other Ambulatory Visit: Payer: Self-pay

## 2020-09-11 DIAGNOSIS — S70211A Abrasion, right hip, initial encounter: Secondary | ICD-10-CM | POA: Diagnosis not present

## 2020-09-11 DIAGNOSIS — Y92410 Unspecified street and highway as the place of occurrence of the external cause: Secondary | ICD-10-CM | POA: Diagnosis not present

## 2020-09-11 DIAGNOSIS — Z20822 Contact with and (suspected) exposure to covid-19: Secondary | ICD-10-CM | POA: Diagnosis not present

## 2020-09-11 DIAGNOSIS — M25551 Pain in right hip: Secondary | ICD-10-CM | POA: Diagnosis not present

## 2020-09-11 DIAGNOSIS — M25511 Pain in right shoulder: Secondary | ICD-10-CM | POA: Insufficient documentation

## 2020-09-11 DIAGNOSIS — Z23 Encounter for immunization: Secondary | ICD-10-CM | POA: Diagnosis not present

## 2020-09-11 DIAGNOSIS — S79911A Unspecified injury of right hip, initial encounter: Secondary | ICD-10-CM | POA: Diagnosis present

## 2020-09-11 DIAGNOSIS — S0990XA Unspecified injury of head, initial encounter: Secondary | ICD-10-CM | POA: Insufficient documentation

## 2020-09-11 DIAGNOSIS — S50311A Abrasion of right elbow, initial encounter: Secondary | ICD-10-CM | POA: Insufficient documentation

## 2020-09-11 DIAGNOSIS — T07XXXA Unspecified multiple injuries, initial encounter: Secondary | ICD-10-CM

## 2020-09-11 DIAGNOSIS — I700 Atherosclerosis of aorta: Secondary | ICD-10-CM | POA: Diagnosis not present

## 2020-09-11 LAB — CBC
HCT: 40.4 % (ref 39.0–52.0)
Hemoglobin: 13 g/dL (ref 13.0–17.0)
MCH: 27.9 pg (ref 26.0–34.0)
MCHC: 32.2 g/dL (ref 30.0–36.0)
MCV: 86.7 fL (ref 80.0–100.0)
Platelets: 232 10*3/uL (ref 150–400)
RBC: 4.66 MIL/uL (ref 4.22–5.81)
RDW: 12.4 % (ref 11.5–15.5)
WBC: 4.7 10*3/uL (ref 4.0–10.5)
nRBC: 0 % (ref 0.0–0.2)

## 2020-09-11 LAB — RESP PANEL BY RT-PCR (FLU A&B, COVID) ARPGX2
Influenza A by PCR: NEGATIVE
Influenza B by PCR: NEGATIVE
SARS Coronavirus 2 by RT PCR: NEGATIVE

## 2020-09-11 LAB — I-STAT CHEM 8, ED
BUN: 16 mg/dL (ref 6–20)
Calcium, Ion: 1.22 mmol/L (ref 1.15–1.40)
Chloride: 102 mmol/L (ref 98–111)
Creatinine, Ser: 0.9 mg/dL (ref 0.61–1.24)
Glucose, Bld: 107 mg/dL — ABNORMAL HIGH (ref 70–99)
HCT: 41 % (ref 39.0–52.0)
Hemoglobin: 13.9 g/dL (ref 13.0–17.0)
Potassium: 3.9 mmol/L (ref 3.5–5.1)
Sodium: 139 mmol/L (ref 135–145)
TCO2: 23 mmol/L (ref 22–32)

## 2020-09-11 LAB — COMPREHENSIVE METABOLIC PANEL
ALT: 45 U/L — ABNORMAL HIGH (ref 0–44)
AST: 74 U/L — ABNORMAL HIGH (ref 15–41)
Albumin: 3.9 g/dL (ref 3.5–5.0)
Alkaline Phosphatase: 80 U/L (ref 38–126)
Anion gap: 9 (ref 5–15)
BUN: 15 mg/dL (ref 6–20)
CO2: 25 mmol/L (ref 22–32)
Calcium: 9.8 mg/dL (ref 8.9–10.3)
Chloride: 103 mmol/L (ref 98–111)
Creatinine, Ser: 0.94 mg/dL (ref 0.61–1.24)
GFR, Estimated: >60 mL/min (ref >60)
Glucose, Bld: 109 mg/dL — ABNORMAL HIGH (ref 70–99)
Potassium: 4 mmol/L (ref 3.5–5.1)
Sodium: 137 mmol/L (ref 135–145)
Total Bilirubin: 0.3 mg/dL (ref 0.3–1.2)
Total Protein: 6.6 g/dL (ref 6.5–8.1)

## 2020-09-11 LAB — URINALYSIS, ROUTINE W REFLEX MICROSCOPIC
Bilirubin Urine: NEGATIVE
Glucose, UA: NEGATIVE mg/dL
Hgb urine dipstick: NEGATIVE
Ketones, ur: NEGATIVE mg/dL
Leukocytes,Ua: NEGATIVE
Nitrite: NEGATIVE
Protein, ur: NEGATIVE mg/dL
Specific Gravity, Urine: 1.014 (ref 1.005–1.030)
pH: 7 (ref 5.0–8.0)

## 2020-09-11 LAB — PROTIME-INR
INR: 1.2 (ref 0.8–1.2)
Prothrombin Time: 14.2 seconds (ref 11.4–15.2)

## 2020-09-11 LAB — SAMPLE TO BLOOD BANK

## 2020-09-11 LAB — LACTIC ACID, PLASMA: Lactic Acid, Venous: 0.9 mmol/L (ref 0.5–1.9)

## 2020-09-11 LAB — ETHANOL: Alcohol, Ethyl (B): <10 mg/dL (ref <10)

## 2020-09-11 MED ORDER — ACETAMINOPHEN 500 MG PO TABS
1000.0000 mg | ORAL_TABLET | Freq: Once | ORAL | Status: AC
  Administered 2020-09-11: 1000 mg via ORAL
  Filled 2020-09-11: qty 2

## 2020-09-11 MED ORDER — BACITRACIN ZINC 500 UNIT/GM EX OINT: TOPICAL_OINTMENT | Freq: Two times a day (BID) | CUTANEOUS | Status: DC

## 2020-09-11 MED ORDER — SODIUM CHLORIDE 0.9 % IV BOLUS: 1000.0000 mL | Freq: Once | INTRAVENOUS | Status: DC

## 2020-09-11 MED ORDER — FENTANYL CITRATE (PF) 100 MCG/2ML IJ SOLN: 50.0000 ug | Freq: Once | INTRAMUSCULAR | Status: DC

## 2020-09-11 MED ORDER — TETANUS-DIPHTH-ACELL PERTUSSIS 5-2.5-18.5 LF-MCG/0.5 IM SUSY
0.5000 mL | PREFILLED_SYRINGE | Freq: Once | INTRAMUSCULAR | Status: AC
  Administered 2020-09-11: 0.5 mL via INTRAMUSCULAR
  Filled 2020-09-11: qty 0.5

## 2020-09-11 MED ORDER — IOHEXOL 300 MG/ML  SOLN
100.0000 mL | Freq: Once | INTRAMUSCULAR | Status: AC | PRN
  Administered 2020-09-11: 100 mL via INTRAVENOUS
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
## 2020-09-11 NOTE — ED Notes (Signed)
Patient transported to CT 

## 2020-09-11 NOTE — Discharge Instructions (Addendum)
At this time there does not appear to be the presence of an emergent medical condition, however there is always the potential for conditions to change. Please read and follow the below instructions.  Please return to the Emergency Department immediately for any new or worsening symptoms. Please be sure to follow up with your Primary Care Provider within one week regarding your visit today; please call their office to schedule an appointment even if you are feeling better for a follow-up visit. Your CT scans today showed degenerative changes of your neck, spondylosis of your L4 vertebrae, degenerative changes of your lumbar spine, atherosclerosis.  Your CT scan also showed a hypervascular focus in the left lobe of your liver which may be a hemangioma.  The x-ray of your right leg showed some chondrocalcinosis of your knee.  Please discuss all these incidental findings with your primary care provider at your follow-up visit.  Go to the nearest Emergency Department immediately if: You have fever or chills You have: Loss of feeling (numbness), tingling, or weakness in your arms or legs. Very bad neck pain, especially tenderness in the middle of the back of your neck. A change in your ability to control your pee or poop (stool). More pain in any area of your body. Swelling in any area of your body, especially your legs. Shortness of breath or light-headedness. Chest pain. Blood in your pee, poop, or vomit. Very bad pain in your belly (abdomen) or your back. Very bad headaches or headaches that are getting worse. Sudden vision loss or double vision. Your eye suddenly turns red. The black center of your eye (pupil) is an odd shape or size. You have any new/concerning or worsening of symptoms   Please read the additional information packets attached to your discharge summary.  Do not take your medicine if  develop an itchy rash, swelling in your mouth or lips, or difficulty breathing; call 911 and  seek immediate emergency medical attention if this occurs.  You may review your lab tests and imaging results in their entirety on your MyChart account.  Please discuss all results of fully with your primary care provider and other specialist at your follow-up visit.  Note: Portions of this text may have been transcribed using voice recognition software. Every effort was made to ensure accuracy; however, inadvertent computerized transcription errors may still be present.
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
## 2020-09-11 NOTE — ED Notes (Signed)
Patient discharge instructions reviewed with the patient. The patient verbalized understanding of instructions. Patient discharged. 

## 2022-01-15 ENCOUNTER — Emergency Department (HOSPITAL_COMMUNITY)
Admission: EM | Admit: 2022-01-15 | Discharge: 2022-01-21 | Disposition: A | Discharge status: Other Acute Inpatient Hospital | Payer: Medicare Other | Attending: Emergency Medicine | Admitting: Emergency Medicine

## 2022-01-15 ENCOUNTER — Ambulatory Visit (INDEPENDENT_AMBULATORY_CARE_PROVIDER_SITE_OTHER)
Admission: EM | Admit: 2022-01-15 | Discharge: 2022-01-15 | Disposition: A | Discharge status: Other Acute Inpatient Hospital | Payer: Medicare Other | Source: Home / Self Care | Admitting: Student

## 2022-01-15 ENCOUNTER — Encounter (HOSPITAL_COMMUNITY): Payer: Self-pay | Admitting: Emergency Medicine

## 2022-01-15 DIAGNOSIS — F23 Brief psychotic disorder: Secondary | ICD-10-CM | POA: Diagnosis not present

## 2022-01-15 DIAGNOSIS — Z91A4 Caregiver's other noncompliance with patient's medication regimen: Secondary | ICD-10-CM | POA: Insufficient documentation

## 2022-01-15 DIAGNOSIS — Z20822 Contact with and (suspected) exposure to covid-19: Secondary | ICD-10-CM | POA: Diagnosis not present

## 2022-01-15 DIAGNOSIS — F29 Unspecified psychosis not due to a substance or known physiological condition: Secondary | ICD-10-CM | POA: Insufficient documentation

## 2022-01-15 DIAGNOSIS — Z91010 Allergy to peanuts: Secondary | ICD-10-CM | POA: Diagnosis not present

## 2022-01-15 DIAGNOSIS — F558 Abuse of other non-psychoactive substances: Secondary | ICD-10-CM | POA: Diagnosis not present

## 2022-01-15 DIAGNOSIS — F200 Paranoid schizophrenia: Secondary | ICD-10-CM | POA: Insufficient documentation

## 2022-01-15 DIAGNOSIS — F209 Schizophrenia, unspecified: Secondary | ICD-10-CM | POA: Insufficient documentation

## 2022-01-15 DIAGNOSIS — F1910 Other psychoactive substance abuse, uncomplicated: Secondary | ICD-10-CM

## 2022-01-15 DIAGNOSIS — Z91148 Patient's other noncompliance with medication regimen for other reason: Secondary | ICD-10-CM

## 2022-01-15 LAB — CBC WITH DIFFERENTIAL/PLATELET
Abs Immature Granulocytes: 0.01 10*3/uL (ref 0.00–0.07)
Basophils Absolute: 0.1 10*3/uL (ref 0.0–0.1)
Basophils Relative: 1 %
Eosinophils Absolute: 0 10*3/uL (ref 0.0–0.5)
Eosinophils Relative: 0 %
HCT: 39.4 % (ref 39.0–52.0)
Hemoglobin: 12.8 g/dL — ABNORMAL LOW (ref 13.0–17.0)
Immature Granulocytes: 0 %
Lymphocytes Relative: 30 %
Lymphs Abs: 1.8 10*3/uL (ref 0.7–4.0)
MCH: 28.8 pg (ref 26.0–34.0)
MCHC: 32.5 g/dL (ref 30.0–36.0)
MCV: 88.7 fL (ref 80.0–100.0)
Monocytes Absolute: 0.7 10*3/uL (ref 0.1–1.0)
Monocytes Relative: 11 %
Neutro Abs: 3.5 10*3/uL (ref 1.7–7.7)
Neutrophils Relative %: 58 %
Platelets: 193 10*3/uL (ref 150–400)
RBC: 4.44 MIL/uL (ref 4.22–5.81)
RDW: 13.1 % (ref 11.5–15.5)
WBC: 6 10*3/uL (ref 4.0–10.5)
nRBC: 0 % (ref 0.0–0.2)

## 2022-01-15 LAB — RESP PANEL BY RT-PCR (FLU A&B, COVID) ARPGX2
Influenza A by PCR: NEGATIVE
Influenza B by PCR: NEGATIVE
SARS Coronavirus 2 by RT PCR: NEGATIVE

## 2022-01-15 LAB — COMPREHENSIVE METABOLIC PANEL
ALT: 32 U/L (ref 0–44)
AST: 50 U/L — ABNORMAL HIGH (ref 15–41)
Albumin: 4.5 g/dL (ref 3.5–5.0)
Alkaline Phosphatase: 66 U/L (ref 38–126)
Anion gap: 6 (ref 5–15)
BUN: 20 mg/dL (ref 6–20)
CO2: 28 mmol/L (ref 22–32)
Calcium: 10.9 mg/dL — ABNORMAL HIGH (ref 8.9–10.3)
Chloride: 110 mmol/L (ref 98–111)
Creatinine, Ser: 1.03 mg/dL (ref 0.61–1.24)
GFR, Estimated: >60 mL/min (ref >60)
Glucose, Bld: 122 mg/dL — ABNORMAL HIGH (ref 70–99)
Potassium: 3.9 mmol/L (ref 3.5–5.1)
Sodium: 144 mmol/L (ref 135–145)
Total Bilirubin: 0.6 mg/dL (ref 0.3–1.2)
Total Protein: 7.4 g/dL (ref 6.5–8.1)

## 2022-01-15 LAB — ETHANOL: Alcohol, Ethyl (B): <10 mg/dL (ref <10)

## 2022-01-15 MED ORDER — OLANZAPINE 5 MG PO TBDP: 5.0000 mg | ORAL_TABLET | Freq: Three times a day (TID) | ORAL | Status: DC | PRN

## 2022-01-15 MED ORDER — LORAZEPAM 1 MG PO TABS
2.0000 mg | ORAL_TABLET | Freq: Once | ORAL | Status: AC
  Administered 2022-01-15: 2 mg via ORAL
  Filled 2022-01-15: qty 2

## 2022-01-15 MED ORDER — LORAZEPAM 1 MG PO TABS: 1.0000 mg | ORAL_TABLET | ORAL | Status: DC | PRN

## 2022-01-15 MED ORDER — OLANZAPINE 5 MG PO TBDP: 5.0000 mg | ORAL_TABLET | Freq: Two times a day (BID) | ORAL | Status: DC

## 2022-01-15 MED ORDER — DIVALPROEX SODIUM ER 250 MG PO TB24
1250.0000 mg | ORAL_TABLET | Freq: Every day | ORAL | Status: DC
  Administered 2022-01-15 – 2022-01-20 (×5): 1250 mg via ORAL
  Filled 2022-01-15 (×6): qty 1

## 2022-01-15 MED ORDER — ZIPRASIDONE MESYLATE 20 MG IM SOLR: 20.0000 mg | INTRAMUSCULAR | Status: DC | PRN

## 2022-01-15 MED ORDER — LORAZEPAM 1 MG PO TABS: 1.0000 mg | ORAL_TABLET | ORAL | 0 refills | Status: DC | PRN

## 2022-01-15 MED ORDER — OLANZAPINE 10 MG IM SOLR: 5.0000 mg | Freq: Three times a day (TID) | INTRAMUSCULAR | Status: DC | PRN

## 2022-01-15 MED ORDER — RISPERIDONE 2 MG PO TABS
4.0000 mg | ORAL_TABLET | Freq: Every evening | ORAL | Status: DC
  Administered 2022-01-15 – 2022-01-16 (×2): 4 mg via ORAL
  Filled 2022-01-15 (×2): qty 2

## 2022-01-15 MED ORDER — OLANZAPINE 10 MG IM SOLR: 5.0000 mg | Freq: Two times a day (BID) | INTRAMUSCULAR | Status: DC

## 2022-01-15 MED ORDER — TRAZODONE HCL 100 MG PO TABS
100.0000 mg | ORAL_TABLET | Freq: Every day | ORAL | Status: DC
  Administered 2022-01-15 – 2022-01-20 (×5): 100 mg via ORAL
  Filled 2022-01-15 (×6): qty 1

## 2022-01-15 MED ORDER — BENZTROPINE MESYLATE 1 MG PO TABS
1.0000 mg | ORAL_TABLET | Freq: Two times a day (BID) | ORAL | Status: DC
  Administered 2022-01-15 – 2022-01-21 (×11): 1 mg via ORAL
  Filled 2022-01-15: qty 2
  Filled 2022-01-15 (×11): qty 1

## 2022-01-15 MED ORDER — STERILE WATER FOR INJECTION IJ SOLN
INTRAMUSCULAR | Status: AC
  Administered 2022-01-15: 10 mL
  Filled 2022-01-15: qty 10

## 2022-01-15 MED ORDER — OLANZAPINE 5 MG PO TBDP
5.0000 mg | ORAL_TABLET | Freq: Two times a day (BID) | ORAL | Status: DC
  Administered 2022-01-15 – 2022-01-21 (×11): 5 mg via ORAL
  Filled 2022-01-15 (×12): qty 1

## 2022-01-15 MED ORDER — ZIPRASIDONE MESYLATE 20 MG IM SOLR
20.0000 mg | Freq: Once | INTRAMUSCULAR | Status: AC
  Administered 2022-01-15: 20 mg via INTRAMUSCULAR
  Filled 2022-01-15: qty 20

## 2022-01-15 NOTE — Progress Notes (Addendum)
Charles Cortez was transported via [employer] to [facility] ED. He refused VS therefore the EMTALA could not be completed. The charge nurse at [facility] was notified. The IVC paperwork was faxed to [phone].

## 2022-01-15 NOTE — ED Notes (Signed)
Pt has voided since arrival (no concerns for retention), last sample opportunity missed, will encourage pt to provide into specimen cup at next void

## 2022-01-15 NOTE — ED Triage Notes (Signed)
Pt Charles Cortez presents to [facility] under IVC escorted by [employer]. Pt was not cooperative during triage process and states that he does not want to be here. Pt is agitated. Pt denies alcohol or drug use, SI/HI and AVH

## 2022-01-15 NOTE — ED Notes (Signed)
Pt appears to be sleeping, even RR and unlabored, NAD noted, sitter within view of pt for safety, pt remains in hall bed, care on going, will continue to monitor.

## 2022-01-15 NOTE — ED Notes (Signed)
Pt refusing vitals at this time. States "the BP cuff is too tight and my veins shouldn't be poking out like that." RN made aware

## 2022-01-15 NOTE — ED Notes (Signed)
Sister Sine called to check on patient.
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
## 2022-01-15 NOTE — ED Provider Notes (Signed)
[facility]-EMERGENCY DEPT Provider Note   CSN: [id number] Arrival date & time: 01/15/22  1137     History  No chief complaint on file.   Charles Woelfel. is a 56 y.o. male.  HPI Patient is here for evaluation of agitated behavior and destruction of personal property at his home.  Family members petitioned him for IVC and took him to the [facility].  He refused medications there so he was sent here after completing a first examination, by their report.  He is here with IVC paperwork, which I have reviewed.  He was petitioned by his sister.  She was concerned that he is throwing furniture out of his house from the balcony, not taking his medicines, and that he is a danger to himself and others.    Home Medications Prior to Admission medications   Medication Sig Start Date End Date Taking? Authorizing Provider  benztropine (COGENTIN) 1 MG tablet Take 1 tablet (1 mg total) by mouth 2 (two) times daily. 09/23/14   Adonis Brook, NP  divalproex (DEPAKOTE ER) 250 MG 24 hr tablet Take 5 tablets (1,250 mg total) by mouth at bedtime. 09/23/14   Adonis Brook, NP  EPINEPHrine 0.3 mg/0.3 mL IJ SOAJ injection Inject 0.3 mLs (0.3 mg total) into the muscle once. If having allergic reaction 09/26/14   Shade Flood, MD  LORazepam (ATIVAN) 1 MG tablet Take 1 tablet (1 mg total) by mouth as needed for anxiety (severe agitation). 01/15/22   Princess Bruins, DO  losartan-hydrochlorothiazide (HYZAAR) 50-12.5 MG per tablet Take 1 tablet by mouth daily. 09/23/14   Adonis Brook, NP  OLANZapine zydis (ZYPREXA) 5 MG disintegrating tablet Take 1 tablet (5 mg total) by mouth 2 (two) times daily. 01/15/22   Princess Bruins, DO  OLANZapine zydis (ZYPREXA) 5 MG disintegrating tablet Take 1 tablet (5 mg total) by mouth 3 (three) times daily as needed (1st line agitation). 01/15/22   Princess Bruins, DO  risperiDONE (RISPERDAL) 2 MG tablet Take 1 tablet (2 mg total) by mouth daily. 09/23/14   Adonis Brook, NP  risperiDONE (RISPERDAL) 4 MG tablet Take 1 tablet (4 mg total) by mouth every evening. Patient taking differently: Take 4 mg by mouth every evening. Current medication list shows 3 mg not 4mg  09/23/14   11/23/14, NP  risperiDONE microspheres (RISPERDAL CONSTA) 25 MG injection Inject 2 mLs (25 mg total) into the muscle every 14 (fourteen) days. Dose given today.  Next dose due 10/07/2014 09/23/14   11/23/14, NP  sodium chloride (OCEAN) 0.65 % nasal spray Place 1 spray into the nose as needed for congestion. 11/04/14   11/06/14, PA  traZODone (DESYREL) 100 MG tablet Take 1 tablet (100 mg total) by mouth at bedtime. 09/23/14   11/23/14, NP  ziprasidone (GEODON) 20 MG injection Inject 20 mg into the muscle as needed for agitation. 01/15/22   01/17/22, DO      Allergies    Fish-derived products, Princess Bruins isothiocyanate], Other, Peanut-containing drug products, and Pork-derived products    Review of Systems   Review of Systems  Physical Exam Updated Vital Signs Pulse 88   Temp (!) 97.5 F (36.4 C) (Oral)   Resp 16   SpO2 100%  Physical Exam Vitals and nursing note reviewed.  Constitutional:      General: He is not in acute distress.    Appearance: He is well-developed. He is not ill-appearing or diaphoretic.  HENT:  Head: Normocephalic and atraumatic.     Right Ear: External ear normal.     Left Ear: External ear normal.  Eyes:     Conjunctiva/sclera: Conjunctivae normal.     Pupils: Pupils are equal, round, and reactive to light.  Neck:     Trachea: Phonation normal.  Cardiovascular:     Rate and Rhythm: Normal rate.  Pulmonary:     Effort: Pulmonary effort is normal.  Abdominal:     General: There is no distension.  Musculoskeletal:        General: Normal range of motion.     Cervical back: Normal range of motion and neck supple.  Skin:    General: Skin is warm and dry.  Neurological:     Mental Status: He is alert and oriented  to person, place, and time.     Cranial Nerves: No cranial nerve deficit.     Sensory: No sensory deficit.     Motor: No abnormal muscle tone.     Coordination: Coordination normal.  Psychiatric:        Attention and Perception: He is inattentive.        Mood and Affect: Affect is inappropriate.        Speech: Speech normal. Speech is not rapid and pressured.        Behavior: Behavior is hyperactive.        Cognition and Memory: Memory is impaired.        Judgment: Judgment is impulsive and inappropriate.     Comments: He appears comfortable and is communicative.     ED Results / Procedures / Treatments   Labs (all labs ordered are listed, but only abnormal results are displayed) Labs Reviewed - No data to display  EKG None  Radiology No results found.  Procedures Procedures    Medications Ordered in ED Medications  ziprasidone (GEODON) injection 20 mg (20 mg Intramuscular Given 01/15/22 1435)  LORazepam (ATIVAN) tablet 2 mg (2 mg Oral Given 01/15/22 1430)  sterile water (preservative free) injection (10 mLs  Given 01/15/22 1435)    ED Course/ Medical Decision Making/ A&P Clinical Course as of 01/15/22 1632  Fri Jan 15, 2022  1405 He is angry, pacing, threatening, not following request to sit back down.  One of his sisters is here and states that patient's mother is deceased.  We had heard earlier that his mother wanted to take him home.  She states that he is very different than usual, and that she is worried about harming himself or others.  She states he has been abusing some type of illegal substance.  She reports that her other sister has healthcare power of attorney [EW]  [phone] Patient will be given sedating medicines and restrain as needed to obtain labs, and protect him and prevent him from harming others [EW]  1632 At this point the patient is medically cleared for evaluation and treatment by TTS.  He remains sleepy after receiving Geodon and Ativan. [EW]    Clinical  Course User Index [EW] Mancel Bale, MD                           Medical Decision Making Initially when I saw the patient he was calm and cooperative, chatting with the officers with him.  They asked that I withhold decision on treatment, until a family member returned from the magistrate.  The patient decompensated at 1405, was volatile, threatening, and refused to return  to his bed.  He continued to show signs of internal responsiveness.  He required sedation and restraints for safety concerns and to obtain required testing.  Amount and/or Complexity of Data Reviewed Independent Historian: caregiver    Details: Patient's sister, in the ED, 2:05 PM.  She showed me the patient's power of attorney paperwork which indicates that he has a healthcare power of attorney. Labs: ordered.    Details: CBC, metabolic panel, urine drug screen, alcohol level Discussion of management or test interpretation with external provider(s): Consult TTS to evaluate for placement.  Risk Prescription drug management. Decision regarding hospitalization. Risk Details: Patient with history of schizophrenia, presenting after being evaluated at the behavioral health urgent care.  He was initially cooperative then decompensated and became aggressive, angry and threatening.  He required sedation and restraint.  He will need to be medically cleared and evaluated by psychiatry for placement.  I will complete first examination paperwork to hold the patient here.  Critical Care Total time providing critical care: 40 minutes           Final Clinical Impression(s) / ED Diagnoses Final diagnoses:  Acute psychosis (HCC)  Noncompliance with medication regimen  Substance abuse (HCC)    Rx / DC Orders ED Discharge Orders     None         Mancel Bale, MD 01/15/22 1525
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
## 2022-01-16 DIAGNOSIS — F23 Brief psychotic disorder: Secondary | ICD-10-CM | POA: Diagnosis present

## 2022-01-16 DIAGNOSIS — F29 Unspecified psychosis not due to a substance or known physiological condition: Secondary | ICD-10-CM | POA: Diagnosis not present

## 2022-01-16 LAB — RAPID URINE DRUG SCREEN, HOSP PERFORMED
Amphetamines: NOT DETECTED
Barbiturates: NOT DETECTED
Benzodiazepines: POSITIVE — AB
Cocaine: NOT DETECTED
Opiates: NOT DETECTED
Tetrahydrocannabinol: NOT DETECTED

## 2022-01-16 MED ORDER — LORAZEPAM 2 MG/ML IJ SOLN: 2.0000 mg | Freq: Once | INTRAMUSCULAR | Status: AC

## 2022-01-16 MED ORDER — LORAZEPAM 2 MG/ML IJ SOLN
INTRAMUSCULAR | Status: AC
  Administered 2022-01-16: 2 mg via INTRAMUSCULAR
  Filled 2022-01-16: qty 1

## 2022-01-16 NOTE — ED Notes (Signed)
Patient has been calm and appropriate with staff this AM,   Patient resting at this time.
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
## 2022-01-16 NOTE — ED Provider Notes (Signed)
Emergency Medicine Observation Re-evaluation Note  Charles Cortez. is a 56 y.o. male, seen on rounds today.  Pt initially presented to the ED for complaints of No chief complaint on file. Currently, the patient is resting comfortably.  Physical Exam  BP 140/73 (BP Location: Right Arm)   Pulse 80   Temp 97.9 F (36.6 C) (Oral)   Resp 18   SpO2 100%  Physical Exam General: No acute distress Cardiac: Regular rate Lungs: No respiratory distress Psych: Calm  ED Course / MDM  EKG:   I have reviewed the labs performed to date as well as medications administered while in observation.  Recent changes in the last 24 hours include patient had an outburst around 3:30 AM, when he was upset about being managed via telehealth.  Subsequently they were able to calm patient down.  Plan  Current plan is for inpatient admission for further psychiatric care. Charles Cortez. is under involuntary commitment.      Derwood Kaplan, MD 01/16/22 1539

## 2022-01-16 NOTE — ED Notes (Signed)
Patient awake and alert.  Eating breakfast .   Medication compliant.

## 2022-01-16 NOTE — ED Notes (Signed)
Patient rambling at times    Limited insight to illness.  Wants to go home and not sleep all the time.

## 2022-01-16 NOTE — Consult Note (Signed)
Telepsych Consultation   Reason for Consult:  psych consult Referring Physician:  Mancel Bale, MD Location of Patient:  [place] [facility] Location of Provider: Behavioral Health TTS Department  Patient Identification: Charles Cortez. MRN:  [id number] Principal Diagnosis: Acute psychosis (HCC) Diagnosis:  Principal Problem:   Acute psychosis (HCC) Active Problems:   Schizophrenia (HCC)   Total Time spent with patient: 20 minutes  Subjective:   [name]. is a 56 y.o. male patient admitted with acute psychosis.  Patient presents laying in bed. Restless. Alert, paranoid. Unable to answer place, time orientation questions.   "Not my own means. I was forced here". Patient presents guarded and restless.  Illogical, pressured, tangential speech. Cursing. Labile mood. Put fists up to screen several times making comments about "killing' and/or "dying for justice".  Appears paranoid, looking around room and over his shoulders. Perseverative on the subject of cars "driving 75 mph". Poor attention span. Unable to answer all assessment questions.   HPI:  Charles Cortez is a 56 year old male patient with past psychiatric schizophrenia, psychosis who initially presented to [facility] under IVC via [employer] where he was agitated and uncooperative, pt was then transferred to [facility] with acute psychosis requiring IM Ziprasidone. UDS+benzodiazepines, BAL<10. PDMP reviewed, no active prescriptions.   Past Psychiatric History:  schizophrenia, psychosis   Risk to Self:   Risk to Others:   Prior Inpatient Therapy:   Prior Outpatient Therapy:    Past Medical History:  Past Medical History:  Diagnosis Date   Bipolar affective disorder (HCC)    Depression    GERD (gastroesophageal reflux disease)    Hypertension    Paranoid schizophrenia (HCC)     Past Surgical History:  Procedure Laterality Date   FINGER SURGERY     KNEE SURGERY     Family History:  Family History  Problem Relation Age of  Onset   Diabetes Mother    Hyperlipidemia Father    Cancer Maternal Grandfather    Cancer Paternal Grandfather    Family Psychiatric  History: not noted Social History:  Social History   Substance and Sexual Activity  Alcohol Use No     Social History   Substance and Sexual Activity  Drug Use No    Social History   Socioeconomic History   Marital status: Single    Spouse name: Not on file   Number of children: Not on file   Years of education: Not on file   Highest education level: Not on file  Occupational History   Not on file  Tobacco Use   Smoking status: Every Day    Packs/day: 0.50    Years: 30.00    Total pack years: 15.00    Types: Cigarettes   Smokeless tobacco: Not on file  Substance and Sexual Activity   Alcohol use: No   Drug use: No   Sexual activity: Not on file  Other Topics Concern   Not on file  Social History Narrative   ** Merged History Encounter **       Social Determinants of Health   Financial Resource Strain: Not on file  Food Insecurity: Not on file  Transportation Needs: Not on file  Physical Activity: Not on file  Stress: Not on file  Social Connections: Not on file   Additional Social History:    Allergies:   Allergies  Allergen Reactions   Fish-Derived Products    Arlice Colt Isothiocyanate]    Other  ketchup, soy sauce, peanuts   Peanut-Containing Drug Products    Pork-Derived Products Swelling    Labs:  Results for orders placed or performed during the hospital encounter of 01/15/22 (from the past 48 hour(s))  Comprehensive metabolic panel     Status: Abnormal   Collection Time: 01/15/22  2:34 PM  Result Value Ref Range   Sodium 144 135 - 145 mmol/L   Potassium 3.9 3.5 - 5.1 mmol/L   Chloride 110 98 - 111 mmol/L   CO2 28 22 - 32 mmol/L   Glucose, Bld 122 (H) 70 - 99 mg/dL    Comment: Glucose reference range applies only to samples taken after fasting for at least 8 hours.   BUN 20 6 - 20 mg/dL    Creatinine, Ser 0.62 0.61 - 1.24 mg/dL   Calcium 37.6 (H) 8.9 - 10.3 mg/dL   Total Protein 7.4 6.5 - 8.1 g/dL   Albumin 4.5 3.5 - 5.0 g/dL   AST 50 (H) 15 - 41 U/L   ALT 32 0 - 44 U/L   Alkaline Phosphatase 66 38 - 126 U/L   Total Bilirubin 0.6 0.3 - 1.2 mg/dL   GFR, Estimated >28 >31 mL/min    Comment: (NOTE) Calculated using the CKD-EPI Creatinine Equation (2021)    Anion gap 6 5 - 15    Comment: Performed at [facility], [address] [place], [place] [address]  Ethanol     Status: None   Collection Time: 01/15/22  2:34 PM  Result Value Ref Range   Alcohol, Ethyl (B) <10 <10 mg/dL    Comment: (NOTE) Lowest detectable limit for serum alcohol is 10 mg/dL.  For medical purposes only. Performed at [facility], [address], [place], [place] [address]   CBC with Differential     Status: Abnormal   Collection Time: 01/15/22  2:34 PM  Result Value Ref Range   WBC 6.0 4.0 - 10.5 K/uL   RBC 4.44 4.22 - 5.81 MIL/uL   Hemoglobin 12.8 (L) 13.0 - 17.0 g/dL   HCT 10.6 26.9 - 48.5 %   MCV 88.7 80.0 - 100.0 fL   MCH 28.8 26.0 - 34.0 pg   MCHC 32.5 30.0 - 36.0 g/dL   RDW 46.2 70.3 - 50.0 %   Platelets 193 150 - 400 K/uL   nRBC 0.0 0.0 - 0.2 %   Neutrophils Relative % 58 %   Neutro Abs 3.5 1.7 - 7.7 K/uL   Lymphocytes Relative 30 %   Lymphs Abs 1.8 0.7 - 4.0 K/uL   Monocytes Relative 11 %   Monocytes Absolute 0.7 0.1 - 1.0 K/uL   Eosinophils Relative 0 %   Eosinophils Absolute 0.0 0.0 - 0.5 K/uL   Basophils Relative 1 %   Basophils Absolute 0.1 0.0 - 0.1 K/uL   Immature Granulocytes 0 %   Abs Immature Granulocytes 0.01 0.00 - 0.07 K/uL    Comment: Performed at [facility], [address] [place], [place] [address]  Resp Panel by RT-PCR (Flu A&B, Covid) Anterior Nasal Swab     Status: None   Collection Time: 01/15/22  3:26 PM   Specimen: Anterior Nasal Swab  Result Value Ref Range   SARS Coronavirus 2 by RT PCR  NEGATIVE NEGATIVE    Comment: (NOTE) SARS-CoV-2 target nucleic acids are NOT DETECTED.  The SARS-CoV-2 RNA is generally detectable in upper respiratory specimens during the acute phase of infection. The lowest concentration of  SARS-CoV-2 viral copies this assay can detect is 138 copies/mL. A negative result does not preclude SARS-Cov-2 infection and should not be used as the sole basis for treatment or other patient management decisions. A negative result may occur with  improper specimen collection/handling, submission of specimen other than nasopharyngeal swab, presence of viral mutation(s) within the areas targeted by this assay, and inadequate number of viral copies(<138 copies/mL). A negative result must be combined with clinical observations, patient history, and epidemiological information. The expected result is Negative.  Fact Sheet for Patients:  BloggerCourse.com  Fact Sheet for Healthcare Providers:  SeriousBroker.it  This test is no t yet approved or cleared by the [place] FDA and  has been authorized for detection and/or diagnosis of SARS-CoV-2 by FDA under an Emergency Use Authorization (EUA). This EUA will remain  in effect (meaning this test can be used) for the duration of the COVID-19 declaration under Section 564(b)(1) of the Act, 21 U.S.C.section 360bbb-3(b)(1), unless the authorization is terminated  or revoked sooner.       Influenza A by PCR NEGATIVE NEGATIVE   Influenza B by PCR NEGATIVE NEGATIVE    Comment: (NOTE) The Xpert Xpress SARS-CoV-2/FLU/RSV plus assay is intended as an aid in the diagnosis of influenza from Nasopharyngeal swab specimens and should not be used as a sole basis for treatment. Nasal washings and aspirates are unacceptable for Xpert Xpress SARS-CoV-2/FLU/RSV testing.  Fact Sheet for Patients: BloggerCourse.com  Fact Sheet for Healthcare  Providers: SeriousBroker.it  This test is not yet approved or cleared by the [place] FDA and has been authorized for detection and/or diagnosis of SARS-CoV-2 by FDA under an Emergency Use Authorization (EUA). This EUA will remain in effect (meaning this test can be used) for the duration of the COVID-19 declaration under Section 564(b)(1) of the Act, 21 U.S.C. section 360bbb-3(b)(1), unless the authorization is terminated or revoked.  Performed at [facility], [address] [place], [place] [address]   Urine rapid drug screen (hosp performed)     Status: Abnormal   Collection Time: 01/15/22  3:26 PM  Result Value Ref Range   Opiates NONE DETECTED NONE DETECTED   Cocaine NONE DETECTED NONE DETECTED   Benzodiazepines POSITIVE (A) NONE DETECTED   Amphetamines NONE DETECTED NONE DETECTED   Tetrahydrocannabinol NONE DETECTED NONE DETECTED   Barbiturates NONE DETECTED NONE DETECTED    Comment: (NOTE) DRUG SCREEN FOR MEDICAL PURPOSES ONLY.  IF CONFIRMATION IS NEEDED FOR ANY PURPOSE, NOTIFY LAB WITHIN 5 DAYS.  LOWEST DETECTABLE LIMITS FOR URINE DRUG SCREEN Drug Class                     Cutoff (ng/mL) Amphetamine and metabolites    1000 Barbiturate and metabolites    200 Benzodiazepine                 200 Tricyclics and metabolites     300 Opiates and metabolites        300 Cocaine and metabolites        300 THC                            50 Performed at [facility], [address], [place], [place] [address]     Medications:  Current Facility-Administered Medications  Medication Dose Route Frequency Provider Last Rate Last Admin   benztropine (COGENTIN) tablet 1 mg  1 mg Oral BID Mancel Bale,  MD   1 mg at 01/16/22 1044   divalproex (DEPAKOTE ER) 24 hr tablet 1,250 mg  1,250 mg Oral QHS Mancel BaleWentz, Elliott, MD   1,250 mg at 01/15/22 2101   OLANZapine zydis (ZYPREXA) disintegrating tablet 5 mg  5 mg Oral  BID Mancel BaleWentz, Elliott, MD   5 mg at 01/16/22 1044   risperiDONE (RISPERDAL) tablet 4 mg  4 mg Oral QPM Mancel BaleWentz, Elliott, MD   4 mg at 01/15/22 1824   traZODone (DESYREL) tablet 100 mg  100 mg Oral QHS Mancel BaleWentz, Elliott, MD   100 mg at 01/15/22 2102   Current Outpatient Medications  Medication Sig Dispense Refill   benztropine (COGENTIN) 1 MG tablet Take 1 tablet (1 mg total) by mouth 2 (two) times daily. (Patient not taking: Reported on 01/15/2022) 60 tablet 0   divalproex (DEPAKOTE ER) 250 MG 24 hr tablet Take 5 tablets (1,250 mg total) by mouth at bedtime. (Patient not taking: Reported on 01/15/2022) 150 tablet 0   EPINEPHrine 0.3 mg/0.3 mL IJ SOAJ injection Inject 0.3 mLs (0.3 mg total) into the muscle once. If having allergic reaction (Patient not taking: Reported on 01/15/2022) 1 Device 0   LORazepam (ATIVAN) 1 MG tablet Take 1 tablet (1 mg total) by mouth as needed for anxiety (severe agitation). 30 tablet 0   losartan-hydrochlorothiazide (HYZAAR) 50-12.5 MG per tablet Take 1 tablet by mouth daily. (Patient not taking: Reported on 01/15/2022) 30 tablet 0   OLANZapine zydis (ZYPREXA) 5 MG disintegrating tablet Take 1 tablet (5 mg total) by mouth 2 (two) times daily.     OLANZapine zydis (ZYPREXA) 5 MG disintegrating tablet Take 1 tablet (5 mg total) by mouth 3 (three) times daily as needed (1st line agitation).     risperiDONE (RISPERDAL) 2 MG tablet Take 1 tablet (2 mg total) by mouth daily. (Patient not taking: Reported on 01/15/2022) 30 tablet 0   risperiDONE (RISPERDAL) 4 MG tablet Take 1 tablet (4 mg total) by mouth every evening. (Patient not taking: Reported on 01/15/2022) 30 tablet 0   risperiDONE microspheres (RISPERDAL CONSTA) 25 MG injection Inject 2 mLs (25 mg total) into the muscle every 14 (fourteen) days. Dose given today.  Next dose due 10/07/2014 (Patient not taking: Reported on 01/15/2022) 1 each 0   sodium chloride (OCEAN) 0.65 % nasal spray Place 1 spray into the nose as needed for  congestion. (Patient not taking: Reported on 01/15/2022) 30 mL 12   traZODone (DESYREL) 100 MG tablet Take 1 tablet (100 mg total) by mouth at bedtime. (Patient not taking: Reported on 01/15/2022) 30 tablet 0   ziprasidone (GEODON) 20 MG injection Inject 20 mg into the muscle as needed for agitation. 1 each     Musculoskeletal: Strength & Muscle Tone: within normal limits Gait & Station: normal Patient leans: N/A  Psychiatric Specialty Exam:  Presentation  General Appearance: Disheveled  Eye Contact:Fleeting  Speech:Pressured  Speech Volume:Increased  Handedness:Right   Mood and Affect  Mood:Angry; Labile  Affect:Blunt; Inappropriate   Thought Process  Thought Processes:Irrevelant; Disorganized  Descriptions of Associations:Tangential  Orientation:Partial  Thought Content:Delusions; Illogical; Paranoid Ideation; Scattered; Tangential  History of Schizophrenia/Schizoaffective disorder:Yes  Duration of Psychotic Symptoms:Greater than six months  Hallucinations:Hallucinations: Other (comment) (Patient did not engage in evaluation)  Ideas of Reference:Delusions; Paranoia; Percusatory  Suicidal Thoughts:Suicidal Thoughts: No  Homicidal Thoughts:Homicidal Thoughts: No   Sensorium  Memory:Immediate Poor; Recent Poor; Remote Poor  Judgment:Impaired  Insight:Lacking; Poor   Executive Functions  Concentration:Poor  Attention Span:Poor  Recall:Poor  Fund of Knowledge:Poor  Language:Poor   Psychomotor Activity  Psychomotor Activity:Psychomotor Activity: Restlessness   Assets  Assets:Resilience; Physical Health   Sleep  Sleep:Sleep: Poor    Physical Exam: Physical Exam Vitals and nursing note reviewed.  Constitutional:      Appearance: He is normal weight.  HENT:     Head: Normocephalic.     Nose: Nose normal.     Mouth/Throat:     Mouth: Mucous membranes are moist.     Pharynx: Oropharynx is clear.  Eyes:     Pupils: Pupils are equal,  round, and reactive to light.  Cardiovascular:     Rate and Rhythm: Normal rate.     Pulses: Normal pulses.  Pulmonary:     Effort: Pulmonary effort is normal.  Abdominal:     General: Abdomen is flat.  Musculoskeletal:        General: Normal range of motion.     Cervical back: Normal range of motion.  Skin:    General: Skin is warm and dry.  Neurological:     Mental Status: He is alert. He is disoriented.  Psychiatric:        Attention and Perception: He is inattentive.        Mood and Affect: Affect is labile and angry.        Speech: Speech is rapid and pressured and tangential.        Behavior: Behavior is uncooperative, agitated and combative.        Thought Content: Thought content is paranoid and delusional. Thought content does not include homicidal or suicidal ideation. Thought content does not include homicidal or suicidal plan.        Cognition and Memory: Cognition is impaired. Memory is impaired.        Judgment: Judgment is impulsive and inappropriate.    Review of Systems  Psychiatric/Behavioral:  Positive for hallucinations.   All other systems reviewed and are negative.  Blood pressure 140/73, pulse 80, temperature 97.9 F (36.6 C), temperature source Oral, resp. rate 18, SpO2 100 %. There is no height or weight on file to calculate BMI.  Treatment Plan Summary: Daily contact with patient to assess and evaluate symptoms and progress in treatment, Medication management, and Plan restart home medications, seek inpatient psychiatric hospitalization for further observation, stabilization, and treatment.   Disposition: Recommend psychiatric Inpatient admission when medically cleared. Supportive therapy provided about ongoing stressors. Discussed crisis plan, support from social network, calling 911, coming to the Emergency Department, and calling Suicide Hotline.  This service was provided via telemedicine using a 2-way, interactive audio and video  technology.  Names of all persons participating in this telemedicine service and their role in this encounter. Name: Maxie Barb Role: PMHNP  Name: Claris Che Cinderella Role: Attending MD  Name: Charles Cortez Role: patient  Name:  Role:     Loletta Parish, NP 01/16/2022 2:19 PM

## 2022-01-17 DIAGNOSIS — F29 Unspecified psychosis not due to a substance or known physiological condition: Secondary | ICD-10-CM | POA: Diagnosis not present

## 2022-01-17 MED ORDER — RISPERIDONE 1 MG PO TABS
1.0000 mg | ORAL_TABLET | Freq: Every evening | ORAL | Status: DC
  Administered 2022-01-18 – 2022-01-20 (×3): 1 mg via ORAL
  Filled 2022-01-17 (×4): qty 1

## 2022-01-17 MED ORDER — STERILE WATER FOR INJECTION IJ SOLN
INTRAMUSCULAR | Status: AC
  Administered 2022-01-17: 1.2 mL
  Filled 2022-01-17: qty 10

## 2022-01-17 MED ORDER — LORAZEPAM 2 MG/ML IJ SOLN
2.0000 mg | Freq: Once | INTRAMUSCULAR | Status: AC
  Administered 2022-01-17: 2 mg via INTRAVENOUS
  Filled 2022-01-17: qty 1

## 2022-01-17 MED ORDER — ZIPRASIDONE MESYLATE 20 MG IM SOLR
10.0000 mg | Freq: Once | INTRAMUSCULAR | Status: AC
Start: 2022-01-17 — End: 2022-01-17
  Administered 2022-01-17: 10 mg via INTRAMUSCULAR
  Filled 2022-01-17: qty 20

## 2022-01-17 NOTE — ED Provider Notes (Signed)
Emergency Medicine Observation Re-evaluation Note  Charles Cortez. is a 56 y.o. male, seen on rounds today.  Pt initially presented to the ED for complaints of No chief complaint on file. Currently, the patient is resting.  Physical Exam  BP (!) 143/78 (BP Location: Right Arm)   Pulse 78   Temp 97.8 F (36.6 C) (Oral)   Resp 18   SpO2 100%  Physical Exam General: Resting Cardiac: No murmur  Lungs: Clear Psych: No agitation   ED Course / MDM  EKG:   I have reviewed the labs performed to date as well as medications administered while in observation.  Recent changes in the last 24 hours include nursing reports no events overnight.  Plan  Current plan is for awaiting psychiatric placement. Charles Cortez. is under involuntary commitment.      Charles Cortez, Canary Brim, MD 01/17/22 1025
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
## 2022-01-17 NOTE — ED Notes (Signed)
Patient is resting comfortably at this time

## 2022-01-17 NOTE — Progress Notes (Signed)
Per Maxie Barb, NP, patient meets criteria for inpatient treatment. There are no available beds at [facility] today. CSW faxed referrals to the following facilities for review:  [facility]  Pending - Request Sent N/A [address], [place] [address] [phone] --  [facility]  Pending - Request Sent N/A [address] [place] [address] [phone] --  [facility]  Pending - Request Sent N/A [address] [place] [address] [phone] --  [facility]  Pending - Request Sent N/A [address] [place] [address] [phone] --  [facility]  Pending - Request Sent N/A [address] [place] [address] [phone] --  [facility]  Pending - Request Sent N/A [address] [place], [place] [address] [phone] --  [facility]  Pending - Request Sent N/A [address] [place] [address] [phone] --  [facility]  Pending - Request Sent N/A [address], [place] [address] [phone] --  [facility]  Pending - Request Sent N/A [address] [place] [address] [phone] --  [facility]  Pending - Request Sent N/A [address] [place] [address] [phone] --  [facility]  Pending - Request Sent N/A [address], [place] [address] [phone] --  [facility]  Pending - Request Sent N/A [address] [place] [address] [phone] --  [facility]  Pending - Request Sent N/A [address] [place] [address] [phone] --  [facility]  Pending - Request Sent N/A [address] [place] [address] [phone] --  [facility]  Pending - Request Sent N/A [address] [place] [address] [phone] --   TTS will continue to seek bed placement.  Crissie Reese, MSW, Lenice Pressman Phone: [phone] Disposition/TOC

## 2022-01-17 NOTE — ED Notes (Signed)
Sister Spang [phone]) came for a visit.  States that she needs to talk with psych to provide collateral information.

## 2022-01-17 NOTE — ED Provider Notes (Addendum)
Patient is reported to be escalating, aggressive and dangerous.  We will add some more sedation, to help control the situation.  He is under IVC.  He requires placement in psychiatric facility.  We will give a dose of Geodon, 10 mg and Ativan, 2 mg both IM.   Mancel Bale, MD 01/17/22 2119    Mancel Bale, MD 01/17/22 2159

## 2022-01-17 NOTE — ED Notes (Signed)
Patient refused to get his BP taken. Will try again in the morning.

## 2022-01-17 NOTE — ED Notes (Addendum)
Patient refusing medications, escalating and threatening staff. Notified Dr. Effie Shy. New order for IM ativan and geodon. Given medication ordered with security and [employer] present. Will continue to monitor.

## 2022-01-18 DIAGNOSIS — F29 Unspecified psychosis not due to a substance or known physiological condition: Secondary | ICD-10-CM | POA: Diagnosis not present

## 2022-01-18 MED ORDER — LORAZEPAM 2 MG/ML IJ SOLN: 2.0000 mg | Freq: Once | INTRAMUSCULAR | Status: DC

## 2022-01-18 NOTE — ED Provider Notes (Signed)
Emergency Medicine Observation Re-evaluation Note  Rowan Blaker. is a 56 y.o. male, seen on rounds today.  Pt initially presented to the ED for complaints of No chief complaint on file. Currently, the patient is calm, resting.  Physical Exam  BP (!) 144/92 (BP Location: Right Arm)   Pulse 78 Comment: 78  Temp 97.6 F (36.4 C) (Oral)   Resp 18   SpO2 100%  Physical Exam Pulmonary:     Effort: Pulmonary effort is normal.  Neurological:     Mental Status: He is alert.  Psychiatric:        Mood and Affect: Mood normal.     ED Course / MDM  EKG:   I have reviewed the labs performed to date as well as medications administered while in observation.  Recent changes in the last 24 hours include nothing but has needed some meds for agitation.  Plan  Current plan is for psych placement. [name] is under involuntary commitment.      Virgina Norfolk, DO 01/18/22 [phone]

## 2022-01-18 NOTE — ED Notes (Signed)
Patient was calm, cooperative, pleasant and slept most of the night.

## 2022-01-18 NOTE — ED Notes (Signed)
Patient slept the rest of the shift after giving the IM injection. He agreed to getting BP for NT, Hope, with a male security present.

## 2022-01-18 NOTE — BH Assessment (Signed)
BHH Assessment Progress Note   Per Maxie Barb, NP, this pt requires psychiatric hospitalization at this time.  Pt presents under IVC initiated by pt's sister and upheld by EDP Mancel Bale, MD.  [facility] will not be able to accommodate this pt today.  At the direction of Nelly Rout, MD this writer has sought placement for pt at facilities outside of the [facility] system. The following facilities have been contacted to seek placement for this pt, with results as noted:  Beds available, information sent, decision pending: [employer]  At capacity: [facility]   Doylene Canning, [place] Behavioral Health Coordinator [phone]

## 2022-01-19 DIAGNOSIS — F23 Brief psychotic disorder: Secondary | ICD-10-CM

## 2022-01-19 DIAGNOSIS — F29 Unspecified psychosis not due to a substance or known physiological condition: Secondary | ICD-10-CM | POA: Diagnosis not present

## 2022-01-19 MED ORDER — ZIPRASIDONE MESYLATE 20 MG IM SOLR
20.0000 mg | Freq: Once | INTRAMUSCULAR | Status: AC
  Administered 2022-01-19: 20 mg via INTRAMUSCULAR
  Filled 2022-01-19: qty 20

## 2022-01-19 MED ORDER — LORAZEPAM 2 MG/ML IJ SOLN: 1.0000 mg | Freq: Four times a day (QID) | INTRAMUSCULAR | Status: DC | PRN

## 2022-01-19 MED ORDER — LORAZEPAM 1 MG PO TABS
1.0000 mg | ORAL_TABLET | Freq: Four times a day (QID) | ORAL | Status: DC | PRN
Start: 2022-01-19 — End: 2022-01-21
  Filled 2022-01-19: qty 1

## 2022-01-19 MED ORDER — STERILE WATER FOR INJECTION IJ SOLN
INTRAMUSCULAR | Status: AC
  Administered 2022-01-19: 10 mL
  Filled 2022-01-19: qty 10

## 2022-01-19 MED ORDER — MIDAZOLAM HCL (PF) 10 MG/2ML IJ SOLN: 4.0000 mg | Freq: Once | INTRAMUSCULAR | Status: DC

## 2022-01-19 MED ORDER — ZIPRASIDONE MESYLATE 20 MG IM SOLR
20.0000 mg | Freq: Once | INTRAMUSCULAR | Status: AC
  Administered 2022-01-20: 20 mg via INTRAMUSCULAR
  Filled 2022-01-19: qty 20

## 2022-01-19 NOTE — ED Provider Notes (Signed)
Patient is agitated aggressive combative, trying to throw around furniture.  Unable to de-escalate with verbal confrontation.  Security called to bedside.  Requiring chemical sedation.   Cheryll Cockayne, MD 01/19/22 1356

## 2022-01-19 NOTE — ED Notes (Signed)
Pt beginning to become agitated, verbally aggressive. Offered PRN Ativan, pt declines at this time. Pt able to be de-escalated verbally by security.

## 2022-01-19 NOTE — ED Notes (Signed)
Pt continues to refuse blood draw. Pt states he understands what he did wrong and he is ready to go.

## 2022-01-19 NOTE — ED Notes (Signed)
VS delayed at this time d/t agitation.

## 2022-01-19 NOTE — ED Notes (Signed)
NP at bedside assessing pt. Pt became very upset, yelling at staff. NP to write orders.

## 2022-01-19 NOTE — ED Notes (Signed)
Patient very irate refused blood draw, MD Made aware.

## 2022-01-19 NOTE — ED Notes (Signed)
Pt suddenly began screaming in his room, then got up and began throwing things at staff members. Pt slammed door shut, then reopened door and threw bedside table out of room. Security at bedside.

## 2022-01-19 NOTE — Progress Notes (Signed)
[facility] Psych ED Progress Note  01/19/2022 1:10 PM Charles Cortez.  MRN:  [id number]   Subjective:  Patient was seen this morning and he remains manic/Psychotic.  His speech is pressured, rapid and he jumps from one topic to another.  He has been writing on different things and sending to staff.  He has no insight in his mental illness and still believes hr does not belong in the hospital.  He received PRN Ativan ans earlier received his scheduled Olanzapine..  Patient continues to require redirecting and he moves around the room with blanket or linen wrapped around himself.  Patient continues to have conversation with unseen people.  He continues to refuse blood work including Depakote level ordered yesterday. We will continue seeking bed placement at any facility that has a bed.  We will continue to offer medications. Principal Problem: Acute psychosis (HCC) Diagnosis:  Principal Problem:   Acute psychosis (HCC) Active Problems:   Schizophrenia [facility])   ED Assessment Time Calculation: Start Time: 1256 Stop Time: 1310 Total Time in Minutes (Assessment Completion): 14   Past Psychiatric History: see initial Psychiatry evaluation note  [place] Scale:  Flowsheet Row ED from 01/15/2022 in [facility]ENCY DEPT  C-SSRS RISK CATEGORY No Risk       Past Medical History:  Past Medical History:  Diagnosis Date   Bipolar affective disorder (HCC)    Depression    GERD (gastroesophageal reflux disease)    Hypertension    Paranoid schizophrenia (HCC)     Past Surgical History:  Procedure Laterality Date   FINGER SURGERY     KNEE SURGERY     Family History:  Family History  Problem Relation Age of Onset   Diabetes Mother    Hyperlipidemia Father    Cancer Maternal Grandfather    Cancer Paternal Grandfather    Family Psychiatric  History: See initial psychiatry  evaluation note Social History:  Social History   Substance and Sexual Activity  Alcohol Use  No     Social History   Substance and Sexual Activity  Drug Use No    Social History   Socioeconomic History   Marital status: Single    Spouse name: Not on file   Number of children: Not on file   Years of education: Not on file   Highest education level: Not on file  Occupational History   Not on file  Tobacco Use   Smoking status: Every Day    Packs/day: 0.50    Years: 30.00    Total pack years: 15.00    Types: Cigarettes   Smokeless tobacco: Not on file  Substance and Sexual Activity   Alcohol use: No   Drug use: No   Sexual activity: Not on file  Other Topics Concern   Not on file  Social History Narrative   ** Merged History Encounter **       Social Determinants of Health   Financial Resource Strain: Not on file  Food Insecurity: Not on file  Transportation Needs: Not on file  Physical Activity: Not on file  Stress: Not on file  Social Connections: Not on file    Sleep: Fair  Appetite:  Good  Current Medications: Current Facility-Administered Medications  Medication Dose Route Frequency Provider Last Rate Last Admin   benztropine (COGENTIN) tablet 1 mg  1 mg Oral BID Mancel Bale, MD   1 mg at 01/19/22 0916   divalproex (DEPAKOTE ER) 24 hr tablet 1,250 mg  1,250 mg Oral QHS Mancel Bale, MD   1,250 mg at 01/18/22 2100   LORazepam (ATIVAN) tablet 1 mg  1 mg Oral Q6H PRN Dahlia Byes C, NP       Or   LORazepam (ATIVAN) injection 1 mg  1 mg Intramuscular Q6H PRN Dahlia Byes C, NP       OLANZapine zydis (ZYPREXA) disintegrating tablet 5 mg  5 mg Oral BID Mancel Bale, MD   5 mg at 01/19/22 0916   risperiDONE (RISPERDAL) tablet 1 mg  1 mg Oral QPM Leevy-Johnson, Brooke A, NP   1 mg at 01/18/22 1846   traZODone (DESYREL) tablet 100 mg  100 mg Oral QHS Mancel Bale, MD   100 mg at 01/18/22 2101   Current Outpatient Medications  Medication Sig Dispense Refill   benztropine (COGENTIN) 1 MG tablet Take 1 tablet (1 mg total) by mouth 2  (two) times daily. (Patient not taking: Reported on 01/15/2022) 60 tablet 0   divalproex (DEPAKOTE ER) 250 MG 24 hr tablet Take 5 tablets (1,250 mg total) by mouth at bedtime. (Patient not taking: Reported on 01/15/2022) 150 tablet 0   EPINEPHrine 0.3 mg/0.3 mL IJ SOAJ injection Inject 0.3 mLs (0.3 mg total) into the muscle once. If having allergic reaction (Patient not taking: Reported on 01/15/2022) 1 Device 0   LORazepam (ATIVAN) 1 MG tablet Take 1 tablet (1 mg total) by mouth as needed for anxiety (severe agitation). 30 tablet 0   losartan-hydrochlorothiazide (HYZAAR) 50-12.5 MG per tablet Take 1 tablet by mouth daily. (Patient not taking: Reported on 01/15/2022) 30 tablet 0   OLANZapine zydis (ZYPREXA) 5 MG disintegrating tablet Take 1 tablet (5 mg total) by mouth 2 (two) times daily.     OLANZapine zydis (ZYPREXA) 5 MG disintegrating tablet Take 1 tablet (5 mg total) by mouth 3 (three) times daily as needed (1st line agitation).     risperiDONE (RISPERDAL) 2 MG tablet Take 1 tablet (2 mg total) by mouth daily. (Patient not taking: Reported on 01/15/2022) 30 tablet 0   risperiDONE (RISPERDAL) 4 MG tablet Take 1 tablet (4 mg total) by mouth every evening. (Patient not taking: Reported on 01/15/2022) 30 tablet 0   risperiDONE microspheres (RISPERDAL CONSTA) 25 MG injection Inject 2 mLs (25 mg total) into the muscle every 14 (fourteen) days. Dose given today.  Next dose due 10/07/2014 (Patient not taking: Reported on 01/15/2022) 1 each 0   sodium chloride (OCEAN) 0.65 % nasal spray Place 1 spray into the nose as needed for congestion. (Patient not taking: Reported on 01/15/2022) 30 mL 12   traZODone (DESYREL) 100 MG tablet Take 1 tablet (100 mg total) by mouth at bedtime. (Patient not taking: Reported on 01/15/2022) 30 tablet 0   ziprasidone (GEODON) 20 MG injection Inject 20 mg into the muscle as needed for agitation. 1 each     Lab Results: No results found for this or any previous visit (from the past 48  hour(s)).  Blood Alcohol level:  Lab Results  Component Value Date   ETH <10 01/15/2022   ETH <10 09/11/2020    Physical Findings:  CIWA:    COWS:     Musculoskeletal: Strength & Muscle Tone: within normal limits Gait & Station: normal Patient leans: Front  Psychiatric Specialty Exam:  Presentation  General Appearance: Casual  Eye Contact:Good  Speech:Garbled; Pressured (Difficult to understand  foi, rapid speech)  Speech Volume:Increased  Handedness:Right   Mood and Affect  Mood:Euphoric; Irritable; Labile; Angry  Affect:Congruent; Labile   Thought Process  Thought Processes:Disorganized; Irrevelant  Descriptions of Associations:Tangential  Orientation:Full (Time, Place and Person)  Thought Content:Illogical; Delusions; Paranoid Ideation  History of Schizophrenia/Schizoaffective disorder:No  Duration of Psychotic Symptoms:Less than six months  Hallucinations:Hallucinations: -- (did not engage in evaluation much constantlky interupting provider mid sentence)  Ideas of Reference:Delusions; Paranoia; Percusatory  Suicidal Thoughts:Suicidal Thoughts: No  Homicidal Thoughts:Homicidal Thoughts: No   Sensorium  Memory:Immediate Poor; Recent Poor; Remote Poor  Judgment:Poor  Insight:Poor   Executive Functions  Concentration:Poor  Attention Span:Poor  Recall:Poor  Fund of Knowledge:Poor  Language:Poor   Psychomotor Activity  Psychomotor Activity:Psychomotor Activity: Increased; Restlessness   Assets  Assets:-- (none)   Sleep  Sleep:Sleep: Poor    Physical Exam: Physical Exam Vitals and nursing note reviewed.  Constitutional:      Appearance: Normal appearance.  HENT:     Head: Normocephalic and atraumatic.     Nose: Nose normal.  Cardiovascular:     Rate and Rhythm: Normal rate.  Pulmonary:     Effort: Pulmonary effort is normal.  Musculoskeletal:        General: Normal range of motion.     Cervical back: Normal range  of motion.  Skin:    General: Skin is warm and dry.  Neurological:     Mental Status: He is alert and oriented to person, place, and time.    Review of Systems  Constitutional: Negative.   HENT: Negative.    Eyes: Negative.   Respiratory: Negative.    Cardiovascular: Negative.   Gastrointestinal: Negative.   Genitourinary: Negative.   Musculoskeletal: Negative.   Skin: Negative.   Neurological: Negative.   Endo/Heme/Allergies: Negative.   Psychiatric/Behavioral:  The patient is nervous/anxious and has insomnia.        Paranoia, Psychotic   Blood pressure (!) 137/107, pulse 76, temperature 98 F (36.7 C), temperature source Oral, resp. rate 16, SpO2 100 %. There is no height or weight on file to calculate BMI.   Medical Decision Making: Patient continues to meet criteria for inpatient Psychiatry care based on today's evaluation.  He remains agitated, speech is pressured, rapid FOI.  Patient however is accepting his medications.  He is refusing labs and we will continue to try and get Depakote level.  Problem 1: Acute Psychosis  Problem 2: Schizophrenia  Delfin Gant, NP-PMHNP-BC 01/19/2022, 1:10 PM

## 2022-01-19 NOTE — BH Assessment (Signed)
[facility] Assessment Progress Note   Per Dahlia Byes, NP, this involuntary pt continues to require psychiatric hospitalization at this time.  [facility] will not be able to accommodate this pt today.  At the direction of Nelly Rout, MD this writer has sought placement for pt at facilities outside of the [facility] system. The following facilities have been contacted to seek placement for this pt, with results as noted:   Beds available, information sent, decision pending: [facility]  Unable to reach: Mannie Stabile (left message at 13:01)   At capacity: [facility]     Doylene Canning, [place] Behavioral Health Coordinator [phone]

## 2022-01-19 NOTE — ED Notes (Signed)
Pt resting quietly in bed. No S/S of distress.

## 2022-01-20 DIAGNOSIS — F29 Unspecified psychosis not due to a substance or known physiological condition: Secondary | ICD-10-CM | POA: Diagnosis not present

## 2022-01-20 LAB — VALPROIC ACID LEVEL: Valproic Acid Lvl: 49 ug/mL — ABNORMAL LOW (ref 50.0–100.0)

## 2022-01-20 MED ORDER — STERILE WATER FOR INJECTION IJ SOLN
INTRAMUSCULAR | Status: AC
  Filled 2022-01-20: qty 10

## 2022-01-20 NOTE — ED Notes (Signed)
Pt becoming verbally aggressive and threatening to leave. Security at bedside to administer IM geodon.

## 2022-01-20 NOTE — ED Notes (Signed)
Patient is disorganized.   Hyper verbal.   Limited insight to illness.

## 2022-01-20 NOTE — ED Provider Notes (Signed)
Emergency Medicine Observation Re-evaluation Note  Charles Cortez. is a 56 y.o. male, seen on rounds today.  Pt initially presented to the ED for complaints of No chief complaint on file. Currently, the patient is resting comfortably.  Physical Exam  BP 140/81 (BP Location: Left Arm)   Pulse 88   Temp 98.6 F (37 C) (Oral)   Resp 16   SpO2 100%  Physical Exam General: NAD   ED Course / MDM  EKG:EKG Interpretation  Date/Time:  Saturday January 16 2022 11:21:45 EDT Ventricular Rate:  75 PR Interval:  166 QRS Duration: 86 QT Interval:  366 QTC Calculation: 408 R Axis:   96 Text Interpretation: Normal sinus rhythm Rightward axis Borderline ECG When compared with ECG of 14-Sep-2014 08:37, PREVIOUS ECG IS PRESENT since last tracing no significant change Confirmed by Mancel Bale [phone]) on 01/18/2022 4:06:15 PM  I have reviewed the labs performed to date as well as medications administered while in observation.  Recent changes in the last 24 hours include agitation requiring chemical sedation yesterday.  Plan  Current plan is for placement. Charles Cortez. is under involuntary commitment.      [name], MD 01/20/22 [phone]

## 2022-01-20 NOTE — ED Notes (Signed)
Pt resting comfortably at this time, respirations even and unlabored, No further VS taken at this time in order to reduce risk of agitation and promote a healthy sleeping routine.

## 2022-01-20 NOTE — Consult Note (Addendum)
Telepsych Consultation   Reason for Consult: Psych consult Referring Physician: Dr. Rodena Medin Location of Patient: [place] Location of Provider: [facility]  Patient Identification: Charles Cortez. MRN:  [id number] Principal Diagnosis: Acute psychosis (HCC) Diagnosis:  Principal Problem:   Acute psychosis (HCC) Active Problems:   Schizophrenia (HCC)   Total Time spent with patient: 1 hour  Subjective:   Charles Cortez. is a 56 y.o. male patient.  HPI:  As per [facility] intake notes: Patient is here for evaluation of agitated behavior and destruction of personal property at his home.  Family members petitioned him for IVC and took him to the [facility].  He refused medications there so he was sent here after completing a first examination, by their report.  He is here with IVC paperwork, which I have reviewed.  He was petitioned by his sister.  She was concerned that he is throwing furniture out of his house from the balcony, not taking his medicines, and that he is a danger to himself and others.  On assessment today via telepsych, patient presents very garrulous and agitated.  Chart reviewed and findings shared with the treatment team and discussed with Dr. Lucianne Muss.  Alert and oriented x3 to person, place, and time however, not to his present situation.  When asked what brought him to the hospital, ruminates on "Social Security problem, standing up for a purpose and someone driving at 75 mph rather than 35 mph."  Mood is euphoric and affect congruent with full range. Thought process is disorganized and irrelevant.  Thought content illogical with paranoid ideation, delusions, perseveration, rumination, and scattered.  Memory, judgment, and insight poor. Appears to be talking out of his head.  Patient denies suicidal ideation, homicidal ideation, and auditory and visual hallucinations. When asked about sleep quality states, "I used to play football and a very active." Endorses good  appetite and being safe at home. Endorses tobacco smoking of 12 cigars/day.  Instructions provided on cessation of tobacco smoking as it has adverse consequences for the body system. Denies access to firearms or self injurious behavior.  Added, "look at my skin do you see any burn area cut areas, I am clean." Denies drug use, or alcohol use or dependence.  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  Disposition: As per my evaluation, patient continues to need inpatient psychiatric admission when medically cleared.  Patient will require valproate acid level to be drawn. Per lab review, last valproic acid level drawn was on 09/22/2014 with a sub-normal value of 43.9.  [facility] ED treatment team and [facility], ED physician made aware of patient's disposition.  Past Psychiatric History: Acute psychosis, schizophrenia, paranoid schizophrenia, bipolar affective disorder, and depression.  Risk to Self:   Risk to Others:   Prior Inpatient Therapy:   Prior Outpatient Therapy:    Past Medical History:  Past Medical History:  Diagnosis Date   Bipolar affective disorder (HCC)    Depression    GERD (gastroesophageal reflux disease)    Hypertension    Paranoid schizophrenia (HCC)     Past Surgical History:  Procedure Laterality Date   FINGER SURGERY     KNEE SURGERY     Family History:  Family History  Problem Relation Age of Onset   Diabetes Mother    Hyperlipidemia Father    Cancer Maternal Grandfather  Cancer Paternal Grandfather    Family Psychiatric  History: Patient denies Social History:  Social History   Substance and Sexual Activity  Alcohol Use No     Social History   Substance and Sexual Activity  Drug Use No    Social History   Socioeconomic History   Marital status: Single    Spouse name: Not on file   Number of children: Not on file    Years of education: Not on file   Highest education level: Not on file  Occupational History   Not on file  Tobacco Use   Smoking status: Every Day    Packs/day: 0.50    Years: 30.00    Total pack years: 15.00    Types: Cigarettes   Smokeless tobacco: Not on file  Substance and Sexual Activity   Alcohol use: No   Drug use: No   Sexual activity: Not on file  Other Topics Concern   Not on file  Social History Narrative   ** Merged History Encounter **       Social Determinants of Health   Financial Resource Strain: Not on file  Food Insecurity: Not on file  Transportation Needs: Not on file  Physical Activity: Not on file  Stress: Not on file  Social Connections: Not on file   Additional Social History:    Allergies:   Allergies  Allergen Reactions   Fish-Derived Products    Mustard [Allyl Isothiocyanate]    Other      ketchup, soy sauce, peanuts   Peanut-Containing Drug Products    Pork-Derived Products Swelling    Labs: No results found for this or any previous visit (from the past 48 hour(s)).  Medications:  Current Facility-Administered Medications  Medication Dose Route Frequency Provider Last Rate Last Admin   benztropine (COGENTIN) tablet 1 mg  1 mg Oral BID Mancel Bale, MD   1 mg at 01/20/22 0916   divalproex (DEPAKOTE ER) 24 hr tablet 1,250 mg  1,250 mg Oral QHS Mancel Bale, MD   1,250 mg at 01/19/22 2153   LORazepam (ATIVAN) tablet 1 mg  1 mg Oral Q6H PRN Dahlia Byes C, NP       Or   LORazepam (ATIVAN) injection 1 mg  1 mg Intramuscular Q6H PRN Dahlia Byes C, NP       OLANZapine zydis (ZYPREXA) disintegrating tablet 5 mg  5 mg Oral BID Mancel Bale, MD   5 mg at 01/20/22 0916   risperiDONE (RISPERDAL) tablet 1 mg  1 mg Oral QPM Leevy-Johnson, Brooke A, NP   1 mg at 01/19/22 1805   traZODone (DESYREL) tablet 100 mg  100 mg Oral QHS Mancel Bale, MD   100 mg at 01/19/22 2152   Current Outpatient Medications  Medication Sig  Dispense Refill   benztropine (COGENTIN) 1 MG tablet Take 1 tablet (1 mg total) by mouth 2 (two) times daily. (Patient not taking: Reported on 01/15/2022) 60 tablet 0   divalproex (DEPAKOTE ER) 250 MG 24 hr tablet Take 5 tablets (1,250 mg total) by mouth at bedtime. (Patient not taking: Reported on 01/15/2022) 150 tablet 0   EPINEPHrine 0.3 mg/0.3 mL IJ SOAJ injection Inject 0.3 mLs (0.3 mg total) into the muscle once. If having allergic reaction (Patient not taking: Reported on 01/15/2022) 1 Device 0   LORazepam (ATIVAN) 1 MG tablet Take 1 tablet (1 mg total) by mouth as needed for anxiety (severe agitation). 30 tablet 0   losartan-hydrochlorothiazide (HYZAAR) 50-12.5 MG per  tablet Take 1 tablet by mouth daily. (Patient not taking: Reported on 01/15/2022) 30 tablet 0   OLANZapine zydis (ZYPREXA) 5 MG disintegrating tablet Take 1 tablet (5 mg total) by mouth 2 (two) times daily.     OLANZapine zydis (ZYPREXA) 5 MG disintegrating tablet Take 1 tablet (5 mg total) by mouth 3 (three) times daily as needed (1st line agitation).     risperiDONE (RISPERDAL) 2 MG tablet Take 1 tablet (2 mg total) by mouth daily. (Patient not taking: Reported on 01/15/2022) 30 tablet 0   risperiDONE (RISPERDAL) 4 MG tablet Take 1 tablet (4 mg total) by mouth every evening. (Patient not taking: Reported on 01/15/2022) 30 tablet 0   risperiDONE microspheres (RISPERDAL CONSTA) 25 MG injection Inject 2 mLs (25 mg total) into the muscle every 14 (fourteen) days. Dose given today.  Next dose due 10/07/2014 (Patient not taking: Reported on 01/15/2022) 1 each 0   sodium chloride (OCEAN) 0.65 % nasal spray Place 1 spray into the nose as needed for congestion. (Patient not taking: Reported on 01/15/2022) 30 mL 12   traZODone (DESYREL) 100 MG tablet Take 1 tablet (100 mg total) by mouth at bedtime. (Patient not taking: Reported on 01/15/2022) 30 tablet 0   ziprasidone (GEODON) 20 MG injection Inject 20 mg into the muscle as needed for agitation. 1  each     Musculoskeletal: Strength & Muscle Tone: within normal limits Gait & Station: normal Patient leans: N/A  Psychiatric Specialty Exam:  Presentation  General Appearance: Appropriate for Environment; Fairly Groomed; Casual  Eye Contact:Good  Speech:Clear and Coherent; Pressured (Garrulous)  Speech Volume:Increased  Handedness:Right  Mood and Affect  Mood:Euphoric  Affect:Congruent; Full Range  Thought Process  Thought Processes:Disorganized; Irrevelant  Descriptions of Associations:Tangential  Orientation:Full (Time, Place and Person)  Thought Content:Illogical; Delusions; Paranoid Ideation; Perseveration; Rumination; Scattered  History of Schizophrenia/Schizoaffective disorder:Yes  Duration of Psychotic Symptoms:Greater than six months  Hallucinations:Hallucinations: None Description of Visual Hallucinations: none today  Ideas of Reference:Delusions; Paranoia; Percusatory  Suicidal Thoughts:Suicidal Thoughts: No  Homicidal Thoughts:Homicidal Thoughts: No  Sensorium  Memory:Immediate Poor; Recent Fair; Remote Poor  Judgment:Poor  Insight:Poor  Executive Functions  Concentration:Poor  Attention Span:Poor  Recall:Poor  Fund of Knowledge:Poor  Language:Fair  Psychomotor Activity  Psychomotor Activity:Psychomotor Activity: Restlessness; Increased  Assets  Assets:Communication Skills; Physical Health  Sleep  Sleep:Sleep: Fair Number of Hours of Sleep: 0 (Unable to assess due to patient agitation)  Physical Exam: Physical Exam Vitals and nursing note reviewed.  HENT:     Head: Normocephalic and atraumatic.     Right Ear: External ear normal.     Left Ear: External ear normal.     Nose: Nose normal.     Mouth/Throat:     Mouth: Mucous membranes are moist.     Pharynx: Oropharynx is clear.  Eyes:     Extraocular Movements: Extraocular movements intact.     Conjunctiva/sclera: Conjunctivae normal.     Pupils: Pupils are equal,  round, and reactive to light.  Cardiovascular:     Rate and Rhythm: Normal rate.     Pulses: Normal pulses.  Pulmonary:     Effort: Pulmonary effort is normal.  Abdominal:     Palpations: Abdomen is soft.  Genitourinary:    Comments: Deferred Musculoskeletal:        General: Normal range of motion.     Cervical back: Normal range of motion and neck supple.  Skin:    General: Skin is warm.  Neurological:  General: No focal deficit present.     Mental Status: He is alert and oriented to person, place, and time.  Psychiatric:     Comments: Restless and garrulous    Review of Systems  Constitutional: Negative.  Negative for chills and fever.  HENT: Negative.  Negative for hearing loss and tinnitus.   Eyes: Negative.  Negative for blurred vision and double vision.  Respiratory: Negative.  Negative for cough and hemoptysis.   Cardiovascular:  Negative for chest pain and palpitations.  Gastrointestinal: Negative.  Negative for abdominal pain, constipation, diarrhea, heartburn, nausea and vomiting.  Genitourinary: Negative.  Negative for dysuria, frequency and urgency.  Musculoskeletal: Negative.  Negative for back pain, myalgias and neck pain.  Skin: Negative.  Negative for itching and rash.  Neurological: Negative.  Negative for dizziness, tingling, tremors, sensory change, speech change, focal weakness, seizures, loss of consciousness, weakness and headaches.  Endo/Heme/Allergies:  Negative for environmental allergies and polydipsia. Does not bruise/bleed easily.         Fish-derived Products Fish-derived Products   Not Specified  03/30/2014 Deletion Reason:  Arlice Colt Isothiocyanate] Arlice Colt Isothiocyanate]   Not Specified  03/30/2014 Deletion Reason:  Other Other   Not Specified  03/30/2014 ketchup, soy sauce, peanuts Deletion Reason:  Peanut-containing Drug Products Peanut-containing Drug Products   Not Specified  03/30/2014 Deletion Reason:  Pork-derived Products  Pork-derived Products  Swelling Not Specified  03/28/2014    Psychiatric/Behavioral:  Positive for depression and substance abuse. The patient is nervous/anxious and has insomnia.    Blood pressure 140/81, pulse 88, temperature 98.6 F (37 C), temperature source Oral, resp. rate 16, SpO2 100 %. There is no height or weight on file to calculate BMI.  Treatment Plan Summary: Daily contact with patient to assess and evaluate symptoms and progress in treatment and Medication management  Disposition: Recommend psychiatric Inpatient admission when medically cleared.  This service was provided via telemedicine using a 2-way, interactive audio and video technology.  Names of all persons participating in this telemedicine service and their role in this encounter. Name: Lanney Gins Role: Patient  Name: Alan Mulder, NP Role: Provider  Name: Dr. Lucianne Muss Role: Medical Director  Name: Dr. Rodena Medin Role: [facility] EDP    Cecilie Lowers, FNP 01/20/2022 11:01 AM

## 2022-01-20 NOTE — ED Notes (Signed)
Patient rambling at times this shift.  Patient became upset, yelling at TV. Unsure about what patient was talking about and why upset.  Patient turned off TV and calmed down.

## 2022-01-20 NOTE — ED Notes (Signed)
Pt rested comfortably throughout the remainder of the night without incident.

## 2022-01-20 NOTE — ED Notes (Signed)
Sister called patient, had a good interaction. No complaints at this time.

## 2022-01-21 DIAGNOSIS — F29 Unspecified psychosis not due to a substance or known physiological condition: Secondary | ICD-10-CM | POA: Diagnosis not present

## 2022-01-21 MED ORDER — STERILE WATER FOR INJECTION IJ SOLN
INTRAMUSCULAR | Status: AC
  Administered 2022-01-21: 1 mL
  Filled 2022-01-21: qty 10

## 2022-01-21 MED ORDER — ZIPRASIDONE MESYLATE 20 MG IM SOLR
20.0000 mg | Freq: Once | INTRAMUSCULAR | Status: AC
  Administered 2022-01-21: 20 mg via INTRAMUSCULAR
  Filled 2022-01-21: qty 20

## 2022-01-21 NOTE — ED Notes (Signed)
Pt is in the door way wanting to get his property and leave. Is getting more agitated as time goes by.

## 2022-01-21 NOTE — ED Notes (Signed)
Patient DC d off unit to behavioral facility per provider. Patient alert, cooperative, no s/s of distress. DC information and belongings given to sheriff for transport. Patient ambulatory off unit, escorted by sheriff.

## 2022-01-21 NOTE — BH Assessment (Signed)
[facility] Assessment Progress Note   Per Alan Mulder, NP, this pt requires psychiatric hospitalization at this time.  Pt presents under IVC initiated by pt's sister, which EDP Mancel Bale, MD has upheld.  At 14:24 Kia calls from [facility] to report that pt has been accepted to their facility by Dr Forrestine Him.  Ophelia Shoulder, NP concurs with this decision.  EDP Glynn Octave, MD and pt's nurse, Waynetta Sandy, have been notified, and Waynetta Sandy agrees to call report to [phone] or [phone].  Pt is to be transported via [facility].  Doylene Canning Behavioral Health Coordinator [phone]

## 2022-01-21 NOTE — ED Notes (Signed)
Pt took off out of the back door of the TCU, was caught and brought back, laughing, said that all he wants to do is leave and go back to his house.

## 2022-01-21 NOTE — ED Notes (Signed)
Pt is laying down, resting at this time.

## 2022-01-21 NOTE — ED Notes (Signed)
Pt is in room talking to himself.
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
## 2022-11-04 ENCOUNTER — Encounter (HOSPITAL_COMMUNITY): Payer: Self-pay

## 2022-11-04 ENCOUNTER — Other Ambulatory Visit: Payer: Self-pay

## 2022-11-04 ENCOUNTER — Emergency Department (HOSPITAL_COMMUNITY)
Admission: EM | Admit: 2022-11-04 | Discharge: 2022-11-07 | Disposition: A | Discharge status: Home / Self Care | Payer: 59 | Attending: Emergency Medicine | Admitting: Emergency Medicine

## 2022-11-04 DIAGNOSIS — R451 Restlessness and agitation: Secondary | ICD-10-CM | POA: Diagnosis present

## 2022-11-04 DIAGNOSIS — F209 Schizophrenia, unspecified: Secondary | ICD-10-CM | POA: Insufficient documentation

## 2022-11-04 DIAGNOSIS — Z91010 Allergy to peanuts: Secondary | ICD-10-CM | POA: Diagnosis not present

## 2022-11-04 DIAGNOSIS — F23 Brief psychotic disorder: Secondary | ICD-10-CM | POA: Diagnosis present

## 2022-11-04 DIAGNOSIS — F29 Unspecified psychosis not due to a substance or known physiological condition: Secondary | ICD-10-CM | POA: Insufficient documentation

## 2022-11-04 DIAGNOSIS — Z79899 Other long term (current) drug therapy: Secondary | ICD-10-CM | POA: Insufficient documentation

## 2022-11-04 LAB — COMPREHENSIVE METABOLIC PANEL
ALT: 31 U/L (ref 0–44)
AST: 54 U/L — ABNORMAL HIGH (ref 15–41)
Albumin: 4.2 g/dL (ref 3.5–5.0)
Alkaline Phosphatase: 62 U/L (ref 38–126)
Anion gap: 5 (ref 5–15)
BUN: 9 mg/dL (ref 6–20)
CO2: 27 mmol/L (ref 22–32)
Calcium: 9.6 mg/dL (ref 8.9–10.3)
Chloride: 104 mmol/L (ref 98–111)
Creatinine, Ser: 1.07 mg/dL (ref 0.61–1.24)
GFR, Estimated: >60 mL/min (ref >60)
Glucose, Bld: 95 mg/dL (ref 70–99)
Potassium: 3.3 mmol/L — ABNORMAL LOW (ref 3.5–5.1)
Sodium: 136 mmol/L (ref 135–145)
Total Bilirubin: 0.7 mg/dL (ref 0.3–1.2)
Total Protein: 6.9 g/dL (ref 6.5–8.1)

## 2022-11-04 LAB — CBC WITH DIFFERENTIAL/PLATELET
Abs Immature Granulocytes: 0 10*3/uL (ref 0.00–0.07)
Basophils Absolute: 0.1 10*3/uL (ref 0.0–0.1)
Basophils Relative: 1 %
Eosinophils Absolute: 0 10*3/uL (ref 0.0–0.5)
Eosinophils Relative: 1 %
HCT: 38.1 % — ABNORMAL LOW (ref 39.0–52.0)
Hemoglobin: 12.5 g/dL — ABNORMAL LOW (ref 13.0–17.0)
Immature Granulocytes: 0 %
Lymphocytes Relative: 34 %
Lymphs Abs: 1.8 10*3/uL (ref 0.7–4.0)
MCH: 28.5 pg (ref 26.0–34.0)
MCHC: 32.8 g/dL (ref 30.0–36.0)
MCV: 87 fL (ref 80.0–100.0)
Monocytes Absolute: 0.5 10*3/uL (ref 0.1–1.0)
Monocytes Relative: 10 %
Neutro Abs: 2.8 10*3/uL (ref 1.7–7.7)
Neutrophils Relative %: 54 %
Platelets: 228 10*3/uL (ref 150–400)
RBC: 4.38 MIL/uL (ref 4.22–5.81)
RDW: 11.9 % (ref 11.5–15.5)
WBC: 5.2 10*3/uL (ref 4.0–10.5)
nRBC: 0 % (ref 0.0–0.2)

## 2022-11-04 LAB — SALICYLATE LEVEL: Salicylate Lvl: <7 mg/dL — ABNORMAL LOW (ref 7.0–30.0)

## 2022-11-04 LAB — ETHANOL: Alcohol, Ethyl (B): <10 mg/dL (ref <10)

## 2022-11-04 MED ORDER — HALOPERIDOL LACTATE 5 MG/ML IJ SOLN
5.0000 mg | Freq: Once | INTRAMUSCULAR | Status: AC
  Administered 2022-11-04: 5 mg via INTRAMUSCULAR
  Filled 2022-11-04: qty 1

## 2022-11-04 MED ORDER — LORAZEPAM 2 MG/ML IJ SOLN
2.0000 mg | Freq: Once | INTRAMUSCULAR | Status: AC
  Administered 2022-11-04: 2 mg via INTRAMUSCULAR
  Filled 2022-11-04: qty 1

## 2022-11-04 MED ORDER — TRAZODONE HCL 100 MG PO TABS
100.0000 mg | ORAL_TABLET | Freq: Every day | ORAL | Status: DC
  Administered 2022-11-05 – 2022-11-06 (×2): 100 mg via ORAL
  Filled 2022-11-04 (×3): qty 1

## 2022-11-04 MED ORDER — DIPHENHYDRAMINE HCL 50 MG/ML IJ SOLN
50.0000 mg | Freq: Once | INTRAMUSCULAR | Status: AC
  Administered 2022-11-04: 50 mg via INTRAMUSCULAR
  Filled 2022-11-04: qty 1

## 2022-11-04 MED ORDER — LOSARTAN POTASSIUM 50 MG PO TABS
50.0000 mg | ORAL_TABLET | Freq: Every day | ORAL | Status: DC
Start: 2022-11-05 — End: 2022-11-07
  Administered 2022-11-05 – 2022-11-07 (×3): 50 mg via ORAL
  Filled 2022-11-04 (×3): qty 1

## 2022-11-04 MED ORDER — HYDROCHLOROTHIAZIDE 12.5 MG PO TABS
12.5000 mg | ORAL_TABLET | Freq: Every day | ORAL | Status: DC
Start: 2022-11-05 — End: 2022-11-07
  Administered 2022-11-05 – 2022-11-07 (×3): 12.5 mg via ORAL
  Filled 2022-11-04 (×3): qty 1

## 2022-11-04 MED ORDER — BENZTROPINE MESYLATE 1 MG PO TABS
1.0000 mg | ORAL_TABLET | Freq: Two times a day (BID) | ORAL | Status: DC
  Administered 2022-11-05 – 2022-11-07 (×5): 1 mg via ORAL
  Filled 2022-11-04: qty 2
  Filled 2022-11-04 (×5): qty 1

## 2022-11-04 MED ORDER — OLANZAPINE 5 MG PO TBDP
5.0000 mg | ORAL_TABLET | Freq: Two times a day (BID) | ORAL | Status: DC
  Administered 2022-11-05: 5 mg via ORAL
  Filled 2022-11-04 (×2): qty 1

## 2022-11-04 MED ORDER — LORAZEPAM 1 MG PO TABS
1.0000 mg | ORAL_TABLET | ORAL | Status: DC | PRN
Start: 2022-11-04 — End: 2022-11-07

## 2022-11-04 MED ORDER — DIVALPROEX SODIUM ER 500 MG PO TB24
1250.0000 mg | ORAL_TABLET | Freq: Every day | ORAL | Status: DC
  Administered 2022-11-05 – 2022-11-06 (×2): 1250 mg via ORAL
  Filled 2022-11-04 (×3): qty 1

## 2022-11-04 MED ORDER — RISPERIDONE 2 MG PO TABS
4.0000 mg | ORAL_TABLET | Freq: Every evening | ORAL | Status: DC
  Administered 2022-11-05 – 2022-11-06 (×2): 4 mg via ORAL
  Filled 2022-11-04: qty 2
  Filled 2022-11-04: qty 8
  Filled 2022-11-04: qty 2

## 2022-11-04 MED ORDER — LOSARTAN POTASSIUM-HCTZ 50-12.5 MG PO TABS: 1.0000 | ORAL_TABLET | Freq: Every day | ORAL | Status: DC

## 2022-11-04 NOTE — ED Triage Notes (Signed)
Patient brought in by [employer] with IVC. Patient staying at hotel and per PD, pt's sister called for IVC due to not taking meds for schizophrenia and bipolar. Patient arrived handcuffed and hyperactive talking about random things.

## 2022-11-04 NOTE — ED Notes (Signed)
Pt asleep att. Will attempt to obtain urine sample when appropriate

## 2022-11-04 NOTE — ED Provider Notes (Signed)
[place] EMERGENCY DEPARTMENT AT [facility] Provider Note   CSN: [id number] Arrival date & time: 11/04/22  2018     History  Chief Complaint  Patient presents with   Mental Health Problem    Charles Cortez. is a 57 y.o. male history of schizophrenia, here presenting with agitation and mania.  Patient apparently is living at a hotel and was aggressive towards other clients.  Apparently the police was called yesterday.  He was again aggressive towards other gas as well as the management.  He was cursing and refuses to answer questions.  Patient had similar episodes of mania previously that required psych hospitalizations.  Patient was IVC by the police.  The history is provided by the patient.       Home Medications Prior to Admission medications   Medication Sig Start Date End Date Taking? Authorizing Provider  benztropine (COGENTIN) 1 MG tablet Take 1 tablet (1 mg total) by mouth 2 (two) times daily. Patient not taking: Reported on 01/15/2022 09/23/14   Adonis Brook, NP  divalproex (DEPAKOTE ER) 250 MG 24 hr tablet Take 5 tablets (1,250 mg total) by mouth at bedtime. Patient not taking: Reported on 01/15/2022 09/23/14   Adonis Brook, NP  EPINEPHrine 0.3 mg/0.3 mL IJ SOAJ injection Inject 0.3 mLs (0.3 mg total) into the muscle once. If having allergic reaction Patient not taking: Reported on 01/15/2022 09/26/14   Shade Flood, MD  LORazepam (ATIVAN) 1 MG tablet Take 1 tablet (1 mg total) by mouth as needed for anxiety (severe agitation). 01/15/22   Princess Bruins, DO  losartan-hydrochlorothiazide (HYZAAR) 50-12.5 MG per tablet Take 1 tablet by mouth daily. Patient not taking: Reported on 01/15/2022 09/23/14   Adonis Brook, NP  OLANZapine zydis (ZYPREXA) 5 MG disintegrating tablet Take 1 tablet (5 mg total) by mouth 2 (two) times daily. 01/15/22   Princess Bruins, DO  OLANZapine zydis (ZYPREXA) 5 MG disintegrating tablet Take 1 tablet (5 mg total) by mouth 3 (three)  times daily as needed (1st line agitation). 01/15/22   Princess Bruins, DO  risperiDONE (RISPERDAL) 2 MG tablet Take 1 tablet (2 mg total) by mouth daily. Patient not taking: Reported on 01/15/2022 09/23/14   Adonis Brook, NP  risperiDONE (RISPERDAL) 4 MG tablet Take 1 tablet (4 mg total) by mouth every evening. Patient not taking: Reported on 01/15/2022 09/23/14   Adonis Brook, NP  risperiDONE microspheres (RISPERDAL CONSTA) 25 MG injection Inject 2 mLs (25 mg total) into the muscle every 14 (fourteen) days. Dose given today.  Next dose due 10/07/2014 Patient not taking: Reported on 01/15/2022 09/23/14   Adonis Brook, NP  sodium chloride (OCEAN) 0.65 % nasal spray Place 1 spray into the nose as needed for congestion. Patient not taking: Reported on 01/15/2022 11/04/14   Porfirio Oar, PA  traZODone (DESYREL) 100 MG tablet Take 1 tablet (100 mg total) by mouth at bedtime. Patient not taking: Reported on 01/15/2022 09/23/14   Adonis Brook, NP  ziprasidone (GEODON) 20 MG injection Inject 20 mg into the muscle as needed for agitation. 01/15/22   Princess Bruins, DO      Allergies    Fish-derived products, Arlice Colt isothiocyanate], Other, Peanut-containing drug products, and Pork-derived products    Review of Systems   Review of Systems  Psychiatric/Behavioral:  Positive for agitation and hallucinations.   All other systems reviewed and are negative.   Physical Exam Updated Vital Signs There were no vitals taken for this visit. Physical Exam Vitals  and nursing note reviewed.  Constitutional:      Comments: Agitated and aggressive  HENT:     Head: Normocephalic.     Nose: Nose normal.     Mouth/Throat:     Mouth: Mucous membranes are moist.  Eyes:     Extraocular Movements: Extraocular movements intact.     Pupils: Pupils are equal, round, and reactive to light.  Cardiovascular:     Rate and Rhythm: Normal rate.  Pulmonary:     Effort: Pulmonary effort is normal.     Breath  sounds: Normal breath sounds.  Abdominal:     General: Abdomen is flat.     Palpations: Abdomen is soft.  Musculoskeletal:     Cervical back: Normal range of motion and neck supple.  Neurological:     Mental Status: He is alert.  Psychiatric:     Comments: Agitated and aggressive.  Patient is responding to internal stimuli     ED Results / Procedures / Treatments   Labs (all labs ordered are listed, but only abnormal results are displayed) Labs Reviewed  CBC WITH DIFFERENTIAL/PLATELET  COMPREHENSIVE METABOLIC PANEL  ETHANOL  SALICYLATE LEVEL  RAPID URINE DRUG SCREEN, HOSP PERFORMED    EKG None  Radiology No results found.  Procedures Procedures    Medications Ordered in ED Medications  haloperidol lactate (HALDOL) injection 5 mg (5 mg Intramuscular Given 11/04/22 2048)  LORazepam (ATIVAN) injection 2 mg (2 mg Intramuscular Given 11/04/22 2048)  diphenhydrAMINE (BENADRYL) injection 50 mg (50 mg Intramuscular Given 11/04/22 2048)    ED Course/ Medical Decision Making/ A&P                             Medical Decision Making Charles Cortez. is a 57 y.o. male history of schizophrenia here presenting with mania and psychosis.  Patient is very agitated and responding to internal stimuli.  Patient is also cursing at staff.  Patient was IVC prior to arrival by police and I filled out the first exam.  Will get medical clearance labs and consult TTS  10:43 PM CBC and CMP unremarkable.  Tox levels are pending.  Patient has no signs of head trauma or any chest or abdominal trauma.  At this point patient is medically cleared for psych eval.   Problems Addressed: Psychosis, unspecified psychosis type: acute illness or injury  Amount and/or Complexity of Data Reviewed Labs: ordered. Decision-making details documented in ED Course.  Risk Prescription drug management.    Final Clinical Impression(s) / ED Diagnoses Final diagnoses:  None    Rx / DC Orders ED  Discharge Orders     None         Charlynne Pander, MD 11/04/22 2244

## 2022-11-04 NOTE — BH Assessment (Signed)
Clinician reached out to RN and NT via chat to complete TTS assessment. Clinician informed by RN that patient is sedated at this time. Clinician requested to be notified if patient wakes up.  Manfred Arch, MSW, LCSW Triage Specialist

## 2022-11-04 NOTE — ED Notes (Signed)
Pt refused for vitals to be taken and became angry with the tech.

## 2022-11-04 NOTE — ED Notes (Signed)
Pt declined to take meds

## 2022-11-05 DIAGNOSIS — F29 Unspecified psychosis not due to a substance or known physiological condition: Secondary | ICD-10-CM | POA: Diagnosis not present

## 2022-11-05 LAB — VALPROIC ACID LEVEL: Valproic Acid Lvl: <10 ug/mL — ABNORMAL LOW (ref 50.0–100.0)

## 2022-11-05 LAB — RAPID URINE DRUG SCREEN, HOSP PERFORMED
Amphetamines: NOT DETECTED
Barbiturates: NOT DETECTED
Benzodiazepines: NOT DETECTED
Cocaine: NOT DETECTED
Opiates: NOT DETECTED
Tetrahydrocannabinol: NOT DETECTED

## 2022-11-05 MED ORDER — OLANZAPINE 5 MG PO TBDP: 5.0000 mg | ORAL_TABLET | Freq: Three times a day (TID) | ORAL | Status: DC | PRN

## 2022-11-05 MED ORDER — LORAZEPAM 1 MG PO TABS: 1.0000 mg | ORAL_TABLET | ORAL | Status: DC | PRN

## 2022-11-05 MED ORDER — ZIPRASIDONE MESYLATE 20 MG IM SOLR: 20.0000 mg | INTRAMUSCULAR | Status: DC | PRN

## 2022-11-05 NOTE — Progress Notes (Signed)
LCSW Progress Note  [id number]   Charles Cortez  11/05/2022  2:02 PM  Description:   Inpatient Psychiatric Referral  Patient was recommended inpatient per [facility], PMHNP. There are no available beds at [facility]. Patient was referred to the following facilities:   Destination  Service Provider Address Phone Fax  [facility]  [address], [place] [address] [phone]  [facility]  [address] [place] [address] [phone]  [facility]  [address], [place] [address] [phone]  [facility]  [address] [place], [place] [address] [phone]  [facility] [address] [place]  [address] [place] [address] [phone]  [facility]  [address] [place], [place] [address] [phone]  [facility]  [address] [place] [address] [phone]  [facility]  [address] [place] [address] [phone]  [facility]  [address] [place] [address] [phone]  [facility]  [address] [place], [place] [address] [phone]  [facility]  [address] [place] [address] [phone]  [facility]  [address] [place] [address] [phone]  [facility]  [address] [place] [address] [phone]  [facility]  [address] [place] [address] [phone]  [facility]  [address], [place] [address] [phone]  [facility]  [address], [place] [address] [phone]  [facility]  [address] [place] [address] [phone]  [facility]  [address], [place] [address] [phone]  [facility]  [address], [place] [address] [phone]  [facility]  [address], [place] [address] [phone]  [facility]  [address] [place] [address] [phone]  [facility]  [address] [place] [address] [phone]  [facility]  [address], [place] [address] [phone]  [facility]  [address] [place] [address] [phone]  [facility]  [address] [place] [address] [phone]  [facility]  [address], [place] [address] [phone]  [facility]  [address] [place] [address] [phone]  [facility]  [address] [place] [address] [phone]     Situation ongoing, CSW to continue following and update chart as more information becomes available.      Cathie Beams, [place]  11/05/2022 2:02 PM

## 2022-11-05 NOTE — ED Notes (Signed)
Pt calm, and cooperative at this time.

## 2022-11-05 NOTE — Consult Note (Signed)
[facility] ED ASSESSMENT   Reason for Consult:  Psych Consult Referring Physician:  Dr. Silverio Lay Patient Identification: Charles Cortez. MRN:  [id number] ED Chief Complaint: Schizophrenia  Diagnosis:  Principal Problem:   Schizophrenia Active Problems:   Acute psychosis   ED Assessment Time Calculation: Start Time: 1100 Stop Time: 1130 Total Time in Minutes (Assessment Completion): 30    HPI: Per Triage Note: Patient brought in by [employer] with IVC. Patient staying at hotel and per PD, pt's sister called for IVC due to not taking meds for schizophrenia and bipolar. Patient arrived handcuffed and hyperactive talking about random things.    Subjective:  Ceasar Lund, 57 y.o., male patient seen face to face by this provider, consulted with Dr. Lucianne Muss; and chart reviewed on 11/05/22.  On evaluation Ceasar Lund, irritable upon approach.  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  Patient is extremely irritable, and is uninterested in participating in evaluation.  During evaluation Ceasar Lund is laying in his bed in no acute distress.  He is alert, oriented x 3, he is anxious and irritable.  His mood is irritable with congruent affect. He has normal speech, increased volume, and aggressive behavior.  Objectively there is no evidence of psychosis/mania or delusional thinking.  Patient does not appear to be distracted, or pre-occupied.  He denies suicidal/self-harm/homicidal ideation, psychosis, and paranoia.  UDS is negative, BAL is less than 10.  Spoke with patient sister Hattie Perch,  she is a sister who IVCD patient, she states that patient has not been taking his medication and has been more aggressive and manic.  She states that patient is currently homeless but resides at times with his children's mother, but she states the children's mother is taking all of patient's money that he makes and receives.  Leaving the patient to feel like he needs to take care of his home, but patient is not taking medications and stable enough to hold down a job.  She states patient has an ACT team at [facility] and he sees Nolon Nations at [phone] she states patient also is with Koren Shiver, he has a case Production designer, theatre/television/film there and he has a Child psychotherapist at [employer].  She stated that [facility] said they needed an outpatient commitment plan and a psychological evaluation plan in order to help patient get into housing, but due to his manic behavior they are not able to give him his medications and help him become stable.  She said patient was in jail and released in December 2023, from December 2023 until February 2024 he was in [facility] and after he left [facility], he stopped taking his medications.  Mrs. Laural Benes states that she is patient's legal guardian as of October 18, 2022, and she is also his payee.  She feels that patient is a danger to himself and others, as he is aggressive and manic and not compliant with medications.   Past Psychiatric History: schizophrenia   Risk to  Self or Others: Is the patient at risk to self? Yes Has the patient been a risk to self in the past 6 months? Yes Has the patient been a risk to self within the distant past? No Is the patient a risk to others? Yes Has the patient been a risk to others in the past 6 months? No Has the patient been a risk to others within the distant past? No  [place] Scale:  Flowsheet Row ED from 01/15/2022 in [facility] Emergency Department at [facility]  C-SSRS RISK CATEGORY No Risk       AIMS:  , , ,  ,   ASAM:    Substance  Abuse:     Past Medical History:  Past Medical History:  Diagnosis Date   Bipolar affective disorder    Depression    GERD (gastroesophageal reflux disease)    Hypertension    Paranoid schizophrenia     Past Surgical History:  Procedure Laterality Date   FINGER SURGERY     KNEE SURGERY     Family History:  Family History  Problem Relation Age of Onset   Diabetes Mother    Hyperlipidemia Father    Cancer Maternal Grandfather    Cancer Paternal Grandfather     Social History:  Social History   Substance and Sexual Activity  Alcohol Use No     Social History   Substance and Sexual Activity  Drug Use No    Social History   Socioeconomic History   Marital status: Single    Spouse name: Not on file   Number of children: Not on file   Years of education: Not on file   Highest education level: Not on file  Occupational History   Not on file  Tobacco Use   Smoking status: Every Day    Packs/day: 0.50    Years: 30.00    Additional pack years: 0.00    Total pack years: 15.00    Types: Cigarettes   Smokeless tobacco: Not on file  Substance and Sexual Activity   Alcohol use: No   Drug use: No   Sexual activity: Not on file  Other Topics Concern   Not on file  Social History Narrative   ** Merged History Encounter **       Social Determinants of Health   Financial Resource Strain: Not on file  Food Insecurity: Not on file  Transportation Needs: Not on file  Physical Activity: Not on file  Stress: Not on file  Social Connections: Not on file    Allergies:   Allergies  Allergen Reactions   Fish-Derived Products    Mustard [Allyl Isothiocyanate]    Other      ketchup, soy sauce, peanuts   Peanut-Containing Drug Products    Pork-Derived Products Swelling    Labs:  Results for orders placed or performed during the hospital encounter of 11/04/22 (from the past 48 hour(s))  Ethanol     Status: None   Collection Time: 11/04/22  8:35 PM  Result Value  Ref Range   Alcohol, Ethyl (B) <10 <10 mg/dL    Comment: (NOTE) Lowest detectable limit for serum alcohol is 10 mg/dL.  For medical purposes only. Performed at [facility], [address], [place], [place] [address]   Salicylate level     Status: Abnormal   Collection Time: 11/04/22  8:35 PM  Result Value Ref Range   Salicylate Lvl <7.0 (L) 7.0 - 30.0 mg/dL  Comment: Performed at [facility], [address] [place], [place] [address]  CBC with Differential     Status: Abnormal   Collection Time: 11/04/22  9:52 PM  Result Value Ref Range   WBC 5.2 4.0 - 10.5 K/uL   RBC 4.38 4.22 - 5.81 MIL/uL   Hemoglobin 12.5 (L) 13.0 - 17.0 g/dL   HCT 60.4 (L) 54.0 - 98.1 %   MCV 87.0 80.0 - 100.0 fL   MCH 28.5 26.0 - 34.0 pg   MCHC 32.8 30.0 - 36.0 g/dL   RDW 19.1 47.8 - 29.5 %   Platelets 228 150 - 400 K/uL   nRBC 0.0 0.0 - 0.2 %   Neutrophils Relative % 54 %   Neutro Abs 2.8 1.7 - 7.7 K/uL   Lymphocytes Relative 34 %   Lymphs Abs 1.8 0.7 - 4.0 K/uL   Monocytes Relative 10 %   Monocytes Absolute 0.5 0.1 - 1.0 K/uL   Eosinophils Relative 1 %   Eosinophils Absolute 0.0 0.0 - 0.5 K/uL   Basophils Relative 1 %   Basophils Absolute 0.1 0.0 - 0.1 K/uL   Immature Granulocytes 0 %   Abs Immature Granulocytes 0.00 0.00 - 0.07 K/uL    Comment: Performed at [facility], [address], [place], [place] [address]  Comprehensive metabolic panel     Status: Abnormal   Collection Time: 11/04/22  9:52 PM  Result Value Ref Range   Sodium 136 135 - 145 mmol/L   Potassium 3.3 (L) 3.5 - 5.1 mmol/L   Chloride 104 98 - 111 mmol/L   CO2 27 22 - 32 mmol/L   Glucose, Bld 95 70 - 99 mg/dL    Comment: Glucose reference range applies only to samples taken after fasting for at least 8 hours.   BUN 9 6 - 20 mg/dL   Creatinine, Ser 8.65 0.61 - 1.24 mg/dL   Calcium 9.6 8.9 - 78.4 mg/dL   Total Protein 6.9 6.5 - 8.1 g/dL   Albumin 4.2 3.5 - 5.0  g/dL   AST 54 (H) 15 - 41 U/L   ALT 31 0 - 44 U/L   Alkaline Phosphatase 62 38 - 126 U/L   Total Bilirubin 0.7 0.3 - 1.2 mg/dL   GFR, Estimated >69 >62 mL/min    Comment: (NOTE) Calculated using the CKD-EPI Creatinine Equation (2021)    Anion gap 5 5 - 15    Comment: Performed at [facility], [address] [place], [place] [address]  Valproic acid level     Status: Abnormal   Collection Time: 11/04/22 11:20 PM  Result Value Ref Range   Valproic Acid Lvl <10 (L) 50.0 - 100.0 ug/mL    Comment: RESULT CONFIRMED BY MANUAL DILUTION Performed at [facility], [address], [place], [place] [address]   Rapid urine drug screen (hospital performed)     Status: None   Collection Time: 11/05/22 12:06 AM  Result Value Ref Range   Opiates NONE DETECTED NONE DETECTED   Cocaine NONE DETECTED NONE DETECTED   Benzodiazepines NONE DETECTED NONE DETECTED   Amphetamines NONE DETECTED NONE DETECTED   Tetrahydrocannabinol NONE DETECTED NONE DETECTED   Barbiturates NONE DETECTED NONE DETECTED    Comment: (NOTE) DRUG SCREEN FOR MEDICAL PURPOSES ONLY.  IF CONFIRMATION IS NEEDED FOR ANY PURPOSE, NOTIFY LAB WITHIN 5 DAYS.  LOWEST DETECTABLE LIMITS FOR URINE DRUG SCREEN Drug Class  Cutoff (ng/mL) Amphetamine and metabolites    1000 Barbiturate and metabolites    200 Benzodiazepine                 200 Opiates and metabolites        300 Cocaine and metabolites        300 THC                            50 Performed at [facility], [address], [place], [place] [address]     Current Facility-Administered Medications  Medication Dose Route Frequency Provider Last Rate Last Admin   benztropine (COGENTIN) tablet 1 mg  1 mg Oral BID Charlynne Pander, MD   1 mg at 11/05/22 0948   divalproex (DEPAKOTE ER) 24 hr tablet 1,250 mg  1,250 mg Oral QHS Charlynne Pander, MD       losartan (COZAAR) tablet 50 mg  50 mg Oral  Daily Charlynne Pander, MD   50 mg at 11/05/22 6045   And   hydrochlorothiazide (HYDRODIURIL) tablet 12.5 mg  12.5 mg Oral Daily Charlynne Pander, MD   12.5 mg at 11/05/22 4098   LORazepam (ATIVAN) tablet 1 mg  1 mg Oral PRN Charlynne Pander, MD       OLANZapine zydis [facility]) disintegrating tablet 5 mg  5 mg Oral Q8H PRN Motley-Mangrum, Garland Smouse A, PMHNP       And   LORazepam (ATIVAN) tablet 1 mg  1 mg Oral PRN Motley-Mangrum, Hiyab Nhem A, PMHNP       And   ziprasidone (GEODON) injection 20 mg  20 mg Intramuscular PRN Motley-Mangrum, Regena Delucchi A, PMHNP       risperiDONE (RISPERDAL) tablet 4 mg  4 mg Oral QPM Charlynne Pander, MD       traZODone (DESYREL) tablet 100 mg  100 mg Oral QHS Charlynne Pander, MD       Current Outpatient Medications  Medication Sig Dispense Refill   benztropine (COGENTIN) 1 MG tablet Take 1 tablet (1 mg total) by mouth 2 (two) times daily. 60 tablet 0   divalproex (DEPAKOTE ER) 250 MG 24 hr tablet Take 5 tablets (1,250 mg total) by mouth at bedtime. 150 tablet 0   EPINEPHrine 0.3 mg/0.3 mL IJ SOAJ injection Inject 0.3 mLs (0.3 mg total) into the muscle once. If having allergic reaction 1 Device 0   LORazepam (ATIVAN) 1 MG tablet Take 1 tablet (1 mg total) by mouth as needed for anxiety (severe agitation). 30 tablet 0   losartan-hydrochlorothiazide (HYZAAR) 50-12.5 MG per tablet Take 1 tablet by mouth daily. 30 tablet 0   OLANZapine zydis (ZYPREXA) 5 MG disintegrating tablet Take 1 tablet (5 mg total) by mouth 2 (two) times daily.     OLANZapine zydis (ZYPREXA) 5 MG disintegrating tablet Take 1 tablet (5 mg total) by mouth 3 (three) times daily as needed (1st line agitation).     risperiDONE (RISPERDAL) 2 MG tablet Take 1 tablet (2 mg total) by mouth daily. 30 tablet 0   risperiDONE (RISPERDAL) 4 MG tablet Take 1 tablet (4 mg total) by mouth every evening. 30 tablet 0   risperiDONE microspheres (RISPERDAL CONSTA) 25 MG injection Inject 2 mLs (25 mg total) into the  muscle every 14 (fourteen) days. Dose given today.  Next dose due 10/07/2014 1 each 0   sodium chloride (OCEAN) 0.65 % nasal spray Place 1 spray into the  nose as needed for congestion. 30 mL 12   traZODone (DESYREL) 100 MG tablet Take 1 tablet (100 mg total) by mouth at bedtime. 30 tablet 0   ziprasidone (GEODON) 20 MG injection Inject 20 mg into the muscle as needed for agitation. 1 each     Musculoskeletal: Strength & Muscle Tone: within normal limits Gait & Station: normal Patient leans: N/A   Psychiatric Specialty Exam: Presentation  General Appearance:  Bizarre  Eye Contact: Minimal  Speech: Clear and Coherent  Speech Volume: Increased  Handedness: Right   Mood and Affect  Mood: Irritable  Affect: Blunt; Flat   Thought Process  Thought Processes: Coherent  Descriptions of Associations:Loose  Orientation:Partial  Thought Content:WDL  History of Schizophrenia/Schizoaffective disorder:Yes  Duration of Psychotic Symptoms:Greater than six months  Hallucinations:Hallucinations: None  Ideas of Reference:None  Suicidal Thoughts:Suicidal Thoughts: No  Homicidal Thoughts:Homicidal Thoughts: No   Sensorium  Memory: Immediate Fair; Recent Fair  Judgment: Impaired  Insight: Poor   Executive Functions  Concentration: Poor  Attention Span: Poor  Recall: Poor  Fund of Knowledge: Poor  Language: Fair   Psychomotor Activity  Psychomotor Activity:Psychomotor Activity: Normal   Assets  Assets: Communication Skills; Social Support    Sleep  Sleep:Sleep: Fair   Physical Exam: Physical Exam Vitals and nursing note reviewed. Exam conducted with a chaperone present.  Pulmonary:     Effort: Pulmonary effort is normal.  Musculoskeletal:        General: Normal range of motion.     Cervical back: Normal range of motion.  Neurological:     Mental Status: He is alert.  Psychiatric:        Attention and Perception: He is  inattentive.        Mood and Affect: Mood is anxious. Affect is angry.        Speech: Speech normal.        Behavior: Behavior is agitated.        Thought Content: Thought content is delusional.        Judgment: Judgment is inappropriate.    Review of Systems  Constitutional: Negative.   HENT: Negative.    Musculoskeletal: Negative.   Psychiatric/Behavioral:  Positive for hallucinations.    Blood pressure (!) 143/80, pulse 69, temperature 97.9 F (36.6 C), temperature source Oral, resp. rate 18, SpO2 100 %. There is no height or weight on file to calculate BMI.   Medical Decision Making: Patient case review and discussed with Dr. Lucianne Muss. Patient needs inpatient psychiatric admission for stabilization and treatment. Restarted patient home medications. Agitation protocol in place for patient, as he is manic, and refusing to take his medications.     Disposition: Recommend psychiatric inpatient admission.   Alona Bene, PMHNP 11/05/2022 1:38 PM

## 2022-11-05 NOTE — ED Notes (Signed)
Pt extremely upset and yelling over being brought here by police. Nurse tech was able to recognize pt was extremely upset and just expressing himself loudly. After listening to pt he eventually calmed himself down, and was cooperative again.

## 2022-11-06 DIAGNOSIS — F29 Unspecified psychosis not due to a substance or known physiological condition: Secondary | ICD-10-CM | POA: Diagnosis not present

## 2022-11-06 NOTE — ED Notes (Signed)
Pt walking around with a blanket on asking about if he we have a smoking room here at the hospital. Pt made aware that this is a smoke free campus for patients, visitors, and staff. Pt became a little boisterous when given this answer

## 2022-11-06 NOTE — ED Notes (Signed)
Pt took night time meds without issue or complaint

## 2022-11-06 NOTE — ED Notes (Signed)
Patient has been sleeping   will get his vitals when he wakes up

## 2022-11-06 NOTE — ED Provider Notes (Signed)
Emergency Medicine Observation Re-evaluation Note  Kaston Faughn. is a 57 y.o. male, seen on rounds today.  Pt initially presented to the ED for complaints of Mental Health Problem Currently, the patient is asleep.  Physical Exam  BP 117/63 (BP Location: Right Arm)   Pulse 73   Temp 97.6 F (36.4 C) (Oral)   Resp 18   SpO2 100%  Physical Exam General: nad Lungs: no resp distress Psych: calm  ED Course / MDM  EKG:   I have reviewed the labs performed to date as well as medications administered while in observation.  Recent changes in the last 24 hours include tts eval > inpt, intermittent agitation but is redirectable/cooperative with care  Plan  Current plan is for placement.    Sloan Leiter, DO 11/06/22 [phone]

## 2022-11-07 DIAGNOSIS — F29 Unspecified psychosis not due to a substance or known physiological condition: Secondary | ICD-10-CM

## 2022-11-07 MED ORDER — RISPERIDONE 2 MG PO TABS
4.0000 mg | ORAL_TABLET | ORAL | Status: AC
  Administered 2022-11-07: 4 mg via ORAL
  Filled 2022-11-07: qty 2

## 2022-11-07 NOTE — ED Provider Notes (Signed)
  Physical Exam  BP 101/79 (BP Location: Right Arm)   Pulse 76   Temp 98.1 F (36.7 C) (Oral)   Resp 19   SpO2 99%   Physical Exam  Procedures  Procedures  ED Course / MDM    Medical Decision Making Amount and/or Complexity of Data Reviewed Labs: ordered.  Risk Prescription drug management.   Sitting comfortably by the glass in the psychiatric area.  Pending inpatient psychiatric treatment.       Benjiman Core, MD 11/07/22 [phone]

## 2022-11-07 NOTE — Discharge Summary (Signed)
[facility] Psych ED Discharge  11/07/2022 4:59 PM Charles Cortez.  MRN:  [id number]  Principal Problem: Schizophrenia Discharge Diagnoses: Principal Problem:   Schizophrenia Active Problems:   Acute psychosis  Clinical Impression:  Final diagnoses:  Psychosis, unspecified psychosis type     ED Assessment Time Calculation: Start Time: 1000 Stop Time: 1015 Total Time in Minutes (Assessment Completion): 15    Subjective: On evaluation today, the patient is sitting in a chair near the nurses station window, he is reading the Bible, in no acute distress. He is calm and cooperative during this assessment. His appearance is appropriate for environment. His eye contact is good.  Speech is clear and coherent, normal pace and normal volume. He reports his mood is "good".  Affect is congruent with mood.  Thought process is coherent and slightly logical. Thought content is within normal limits. He denies auditory and visual hallucinations.  No indication that he is responding to internal stimuli during this assessment.  No delusions elicited during this assessment.  He denies suicidal ideations. He denies homicidal ideations. Patient asked what is his plan, he has been compliant with medications, and has been cooperative on the unit. Patient discussed with provider, that he has a job he needs to get back too, he works at [employer], so he can support his family.  Support, encouragement and reassurance provided about ongoing stressors and patient provided with opportunity for questions.    Spoke with [name], and she stated that she will come and pick patient up for his discharge.  Discussed with his sister that patient has been compliant with medications, and appropriate and cooperative on the milieu. She stated that she and patient will follow up with his ACTT at [place], and with his case Production designer, theatre/television/film at [place], for housing.     Past Psychiatric History: schizophrenia    Past Medical History:  Past Medical History:  Diagnosis Date   Bipolar affective disorder    Depression    GERD (gastroesophageal reflux disease)    Hypertension    Paranoid schizophrenia     Past Surgical History:  Procedure Laterality Date   FINGER SURGERY     KNEE SURGERY     Family History:  Family History  Problem Relation Age of Onset   Diabetes Mother    Hyperlipidemia Father    Cancer Maternal Grandfather    Cancer Paternal Grandfather     Social History:  Social History   Substance and Sexual Activity  Alcohol Use No     Social History   Substance and Sexual Activity  Drug Use No    Social History   Socioeconomic History   Marital status: Single    Spouse name: Not on file   Number of children: Not on file   Years of education: Not on file   Highest education level: Not on file  Occupational History   Not on file  Tobacco Use   Smoking status: Every Day    Packs/day: 0.50    Years: 30.00    Additional pack years: 0.00    Total pack years: 15.00    Types: Cigarettes   Smokeless tobacco: Not on file  Substance and Sexual Activity   Alcohol use: No   Drug use: No   Sexual activity: Not on file  Other Topics Concern   Not on file  Social History Narrative   ** Merged History Encounter **       Social Determinants of Health  Financial Resource Strain: Not on file  Food Insecurity: Not on file  Transportation Needs: Not on file  Physical Activity: Not on file  Stress: Not on file  Social Connections: Not on file    Tobacco Cessation:  A prescription for an FDA-approved tobacco cessation medication was offered at discharge and the patient refused  Current Medications: Current Facility-Administered Medications  Medication Dose Route Frequency Provider Last Rate Last Admin   benztropine (COGENTIN) tablet 1 mg  1 mg Oral BID Charlynne Pander, MD   1 mg at 11/07/22 0932   divalproex (DEPAKOTE ER) 24 hr tablet 1,250 mg  1,250 mg Oral  QHS Charlynne Pander, MD   1,250 mg at 11/06/22 2159   losartan (COZAAR) tablet 50 mg  50 mg Oral Daily Charlynne Pander, MD   50 mg at 11/07/22 0932   And   hydrochlorothiazide (HYDRODIURIL) tablet 12.5 mg  12.5 mg Oral Daily Charlynne Pander, MD   12.5 mg at 11/07/22 0932   LORazepam (ATIVAN) tablet 1 mg  1 mg Oral PRN Charlynne Pander, MD       OLANZapine zydis (ZYPREXA) disintegrating tablet 5 mg  5 mg Oral Q8H PRN Motley-Mangrum, Usama Harkless A, PMHNP       And   LORazepam (ATIVAN) tablet 1 mg  1 mg Oral PRN Motley-Mangrum, Gabe Glace A, PMHNP       And   ziprasidone (GEODON) injection 20 mg  20 mg Intramuscular PRN Motley-Mangrum, Gael Londo A, PMHNP       risperiDONE (RISPERDAL) tablet 4 mg  4 mg Oral NOW Motley-Mangrum, Braelon Sprung A, PMHNP       traZODone (DESYREL) tablet 100 mg  100 mg Oral QHS Charlynne Pander, MD   100 mg at 11/06/22 2159   Current Outpatient Medications  Medication Sig Dispense Refill   benztropine (COGENTIN) 1 MG tablet Take 1 tablet (1 mg total) by mouth 2 (two) times daily. 60 tablet 0   divalproex (DEPAKOTE ER) 250 MG 24 hr tablet Take 5 tablets (1,250 mg total) by mouth at bedtime. 150 tablet 0   EPINEPHrine 0.3 mg/0.3 mL IJ SOAJ injection Inject 0.3 mLs (0.3 mg total) into the muscle once. If having allergic reaction 1 Device 0   LORazepam (ATIVAN) 1 MG tablet Take 1 tablet (1 mg total) by mouth as needed for anxiety (severe agitation). 30 tablet 0   losartan-hydrochlorothiazide (HYZAAR) 50-12.5 MG per tablet Take 1 tablet by mouth daily. 30 tablet 0   OLANZapine zydis (ZYPREXA) 5 MG disintegrating tablet Take 1 tablet (5 mg total) by mouth 2 (two) times daily.     OLANZapine zydis (ZYPREXA) 5 MG disintegrating tablet Take 1 tablet (5 mg total) by mouth 3 (three) times daily as needed (1st line agitation).     risperiDONE (RISPERDAL) 2 MG tablet Take 1 tablet (2 mg total) by mouth daily. 30 tablet 0   risperiDONE (RISPERDAL) 4 MG tablet Take 1 tablet (4 mg total) by  mouth every evening. 30 tablet 0   risperiDONE microspheres (RISPERDAL CONSTA) 25 MG injection Inject 2 mLs (25 mg total) into the muscle every 14 (fourteen) days. Dose given today.  Next dose due 10/07/2014 1 each 0   sodium chloride (OCEAN) 0.65 % nasal spray Place 1 spray into the nose as needed for congestion. 30 mL 12   traZODone (DESYREL) 100 MG tablet Take 1 tablet (100 mg total) by mouth at bedtime. 30 tablet 0   ziprasidone (GEODON) 20 MG injection Inject  20 mg into the muscle as needed for agitation. 1 each    PTA Medications: (Not in a hospital admission)   [place] Scale:  Flowsheet Row ED from 11/04/2022 in [facility] at [facility] ED from 01/15/2022 in [facility] Emergency Department at [facility]  C-SSRS RISK CATEGORY No Risk No Risk       Musculoskeletal: Strength & Muscle Tone: within normal limits Gait & Station: normal Patient leans: N/A  Psychiatric Specialty Exam: Presentation  General Appearance:  Appropriate for Environment  Eye Contact: Good  Speech: Clear and Coherent  Speech Volume: Normal  Handedness: Right   Mood and Affect  Mood: Euthymic  Affect: Appropriate   Thought Process  Thought Processes: Coherent  Descriptions of Associations:Intact  Orientation:Full (Time, Place and Person)  Thought Content:WDL  History of Schizophrenia/Schizoaffective disorder:Yes  Duration of Psychotic Symptoms:Greater than six months  Hallucinations:Hallucinations: None  Ideas of Reference:None  Suicidal Thoughts:Suicidal Thoughts: No  Homicidal Thoughts:Homicidal Thoughts: No   Sensorium  Memory: Immediate Good; Recent Good  Judgment: Intact  Insight: Fair   Art therapist  Concentration: Fair  Attention Span: Fair  Recall: Fair  Fund of Knowledge: Good  Language: Good   Psychomotor Activity  Psychomotor Activity: Psychomotor Activity: Normal   Assets   Assets: Communication Skills; Social Support   Sleep  Sleep: Sleep: Good    Physical Exam: Physical Exam Musculoskeletal:        General: Normal range of motion.     Cervical back: Normal range of motion.  Neurological:     Mental Status: He is alert.  Psychiatric:        Attention and Perception: Attention normal.        Mood and Affect: Mood normal.        Speech: Speech normal.        Behavior: Behavior is cooperative.        Thought Content: Thought content normal.        Cognition and Memory: Cognition and memory normal.        Judgment: Judgment normal.    Review of Systems  Constitutional: Negative.   HENT: Negative.    Respiratory: Negative.    Musculoskeletal: Negative.   Psychiatric/Behavioral: Negative.     Blood pressure 123/76, pulse 71, temperature 98.2 F (36.8 C), temperature source Oral, resp. rate 16, SpO2 100 %. There is no height or weight on file to calculate BMI.   Demographic Factors:  Male, low socioeconomic factors  Loss Factors: Decrease in vocational status  Historical Factors: Impulsivity  Risk Reduction Factors:   Responsible for children under 28 years of age, Sense of responsibility to family, Religious beliefs about death, Employed, and Positive social support  Continued Clinical Symptoms:  Schizophrenia:   Paranoid or undifferentiated type  Suicide Risk:  Minimal: No identifiable suicidal ideation.  Patients presenting with no risk factors but with morbid ruminations; may be classified as minimal risk based on the severity of the depressive symptoms   Medical Decision Making: Patient is psychiatrically cleared. Patient case review and discussed with Dr. Lucianne Muss, and patient does not meet inpatient criteria for inpatient psychiatric treatment. At time of discharge, patient denies SI, HI, AVH and can contract for safety. He demonstrated no overt evidence of psychosis or mania. Prior to discharge, he verbalized that they  understood warning signs, triggers, and symptoms of worsening mental health and how to access emergency mental health care if they felt it was needed. Patient was instructed  to call 911 or return to the emergency room if they experienced any concerning symptoms after discharge. Patient voiced understanding and agreed to the above.  Patient given resources to follow up with behavioral health urgent care for therapy and medication management. Patient denies access to weapons. Safety planning completed.   Patient sister whom is he legal guardian Charles Cortez, will be coming to pick patient up within the hour, to stay with her, they will follow up with his case manager at [facility] and ACT team at [facility].    Disposition: Patient does not meet criteria for psychiatric inpatient admission. Supportive therapy provided about ongoing stressors. Discussed crisis plan, support from social network, calling 911, coming to the Emergency Department, and calling Suicide Hotline.    Rachel Samples MOTLEY-MANGRUM, PMHNP 11/07/2022, 4:59 PM

## 2022-11-07 NOTE — ED Provider Notes (Signed)
Assumed care of the patient at 1500.  Cleared by psych.  D/c home.    Bentlee Drier, DaMelene Plan4/21/24 [phone]

## 2022-11-07 NOTE — Discharge Instructions (Signed)

## 2022-11-07 NOTE — Progress Notes (Signed)
Pt has been psych cleared per provider Alona Bene, PMHNP. Provider has entered [facility] consult to assist with identified needed. This CSW will now remove pt from the [facility] shift report.   Maryjean Ka, MSW, LCSWA 11/07/2022 6:20 PM

## 2022-11-07 NOTE — Progress Notes (Signed)
CSW received consult for homelessness, shelter resources has been added to pt's AVS. TOC sign off.   Valentina Shaggy.Kaleeyah Cuffie, MSW, LCSWA [facility]  Transitions of Care Clinical Social Worker I Direct Dial: [phone]  Fax: [phone] [email or web address]

## 2022-11-07 NOTE — ED Notes (Addendum)
Pt things collected from locker. Awaiting phone call from legal guardian to meet pt in ED lobby.

## 2023-05-12 ENCOUNTER — Encounter: Payer: Self-pay | Admitting: *Deleted

## 2023-05-12 NOTE — Progress Notes (Addendum)
Pt attended 04/13/23 screening event where his b/p was 158/99. At the event, pt's PCP was listed as Dr. Asa Lente ([place]) and pt did not identify a phone number of any SDOH insecurities. Chart review indicates that pt's last [facility]-visible encounter were ED visits in 2023 and 2024 but no PCP visits visible. Since pt did not list a phone number, all past phone numbers were contacted to try to reach pt. 2 former phone numbers were no longer valid and message left on 3rd number in case still a contact for pt. Dr. Albertina Parr office called at [facility] and admin reported pt had not been seen there since 2022. Pt listed his address as [address] [address], so f/u letter sent to that address offering PCP contact since need to f/u high blood pressure and potential other health issues. In-basket also sent to [facility] Waynetta Sandy and Cheree Ditto, where event took place, in case pt returns and they can reach him for f/u for HTN and possible mental health f/u and/or connection to care/medication. Letter sent to pt advising PCP f/u for HTN, including former PCP contact info, [facility] and community primary care clinic flyer, and [facility] CCNP contact options. Additional pt f/u to be scheduled per health equity protocol. 06/01/23 Letter returned. Dorie Rank, RN, 1:23P

## 2023-06-27 ENCOUNTER — Encounter: Payer: Self-pay | Admitting: *Deleted

## 2023-06-27 NOTE — Progress Notes (Signed)
Pt attended 04/13/23 screening event where his blood pressure was 158/99. At the event, the pt was listed as having a PCP, [facility] but that clinic confirmed in October that pt had not been seen since 2022. Pt did not list any SDOH insecurities but lived in a motel and letter sent to hotel in October in attempt to contact pt was returned. Current phone number used and VM left for pt during this 60 day f/u. Chart review indicates pt has not had any PCP or other healthcare encounter visible in [facility] since the event. During the initial event f/u, CCNP at [facility] also contacted to try to reach pt but there is no CCNP documentation since the event either. Health equity team member unable to reach pt by phone or by letter at this time. Additional pt f/u to be scheduled per health equity protocol in an effort to reach pt about his b/p control as well as his unconfirmed housing status.

## 2023-10-04 NOTE — Congregational Nurse Program (Signed)
 Client brought to RN office to assist with ways to assist client.  He is staying in a [employer]. He has history of schizophrenia. He states he is here for BP check, he states he is not taking any of his psych medications and doesn't want to because they make him not feel like himself. Chart says he has an act team and a sister and RN will follow up with them. Client not wanting help or assistance and would benefit from having PCP to address BP issues but he will need his support system to assist with supporting client. RN will follow up with his sister and ACT team to see how we can help support this client.   RN spoke to sister Elonda Husky to assist client. She states that client has been staying at [facility] for 8 months and new Payee has not paid his rent resulting in him being homeless again. She is working to help him get guardianship with an organization that can better facilitate caring for his needs and helping him take medicines. She has a court date on Friday to help her brother. He also has an ACT team: [place] Mahatha [phone]. RN will reach out to her. He also has a DSS Personal assistant [phone] and a Guardian Ad Litem Leeroy Cha [phone].  Attempted to call ACT team VM left.

## 2023-10-10 ENCOUNTER — Encounter: Payer: Self-pay | Admitting: Physician Assistant

## 2023-10-10 ENCOUNTER — Ambulatory Visit: Admitting: Physician Assistant

## 2023-10-10 DIAGNOSIS — Z5321 Procedure and treatment not carried out due to patient leaving prior to being seen by health care provider: Secondary | ICD-10-CM

## 2023-10-10 LAB — AMB RESULTS CONSOLE CBG: Glucose: 102

## 2023-10-10 NOTE — Progress Notes (Signed)
 Pt came to mobile screening at [facility] ([facility] center) to get a hypertension and blood glucose check. Listed address as [facility] but stated he is homeless and does not have an active phone number at this time. Pt stated that he had eaten a light meal about 8 am with some coffee. Blood glucose level 102. Blood pressure was a bit elevated 183/86> 173/86. Pt stated he does see a provider at [facility] in which he has a apt today. We reviewed his tobacco usage and pt stated he was not ready to quit. Pt was given SDOH resources for food,housing,transportation, and utility. Pt was also advised about he mobile provider unit and coming to see the screening unit at anytime of operation for regular monitoring as transportation is limited to get to normal provider.

## 2023-10-11 NOTE — Progress Notes (Signed)
 Patient not seen.

## 2023-10-18 ENCOUNTER — Encounter: Payer: Self-pay | Admitting: *Deleted

## 2023-10-18 NOTE — Progress Notes (Signed)
 Pt attended 04/13/23 screening event where his blood pressure was 158/99. At the event, the pt was listed as having a PCP, [facility] but [facility] confirmed in October that pt had not been seen since 2022. Pt did not list any SDOH insecurities but lived in a motel and letter sent to hotel in October in attempt to contact pt was returned. Current phone number used and VM left for pt during this 60 day f/u. Chart review indicates pt has not had any PCP or other healthcare encounter visible in [facility] since the event, although he had one with the mobile medicine unit on 10/10/23 but left w/out being seen. During the initial event f/u, CCNP at [facility] also contacted to try to reach pt and CCNP Charles Cortez did make contact with pt on 10/04/23. CCNP Charles Cortez noted pt was homeless again and that his sister Charles Cortez was working to help pt get guardianship "with an organization that can better facilitate caring for his needs and helping him take medicines. She has a court date on Friday to help her brother. He also has an ACT team: Charles Cortez [phone]. RN will reach out to her. He also has a DSS Social Worker Charles Cortez [phone] and a Guardian Ad Litem Charles Cortez [phone]." Since CCNP has already established a contact with pt's sister and pt (and pt is homeless and does not have a current phone number), in-basket message sent to [employer] to offer health equity support in trying to connect pt to ongoing PCP healthcare, including facilitating communication among pt's DSS SW and Guardian ad litem with pt and CCNP. Support to CCNP efforts offered and no additional health equity team support scheduled at this time

## 2023-11-22 ENCOUNTER — Ambulatory Visit: Admitting: Physician Assistant

## 2023-11-22 ENCOUNTER — Other Ambulatory Visit: Payer: Self-pay

## 2023-11-22 ENCOUNTER — Encounter: Payer: Self-pay | Admitting: Physician Assistant

## 2023-11-22 VITALS — BP 146/76 | HR 90 | Ht 69.0 in | Wt 180.0 lb

## 2023-11-22 DIAGNOSIS — Z5900 Homelessness unspecified: Secondary | ICD-10-CM

## 2023-11-22 DIAGNOSIS — M25511 Pain in right shoulder: Secondary | ICD-10-CM

## 2023-11-22 DIAGNOSIS — I10 Essential (primary) hypertension: Secondary | ICD-10-CM

## 2023-11-22 MED ORDER — NAPROXEN 500 MG PO TABS
500.0000 mg | ORAL_TABLET | Freq: Two times a day (BID) | ORAL | 0 refills | Status: DC
  Filled 2023-11-22: qty 30, 15d supply, fill #0

## 2023-11-22 MED ORDER — KETOROLAC TROMETHAMINE 60 MG/2ML IM SOLN
60.0000 mg | Freq: Once | INTRAMUSCULAR | Status: AC
  Administered 2023-11-22: 60 mg via INTRAMUSCULAR

## 2023-11-22 NOTE — Congregational Nurse Program (Signed)
 RN spoke to client and client states he is having significant pain in his R shoulder area, there is redness and bruising in this area. Client has schizophrenia and is somewhat difficult to get clear answer from client on situation. He is able to bend elbow, and move arm, but unable to lift arm above waist, shoulders are symmetrical and not dropping. He is attempting to call his healthcare office for a sling to help him immobilize his arm. RN had NP at [facility] evaluate him and determine it is not dislocated. Will send to mobile clinic since every time he moves his arms he is visibly in pain. Mobile Clinic made aware and RN is arranging transport to get client to Mobile clinic. Bus pass given for return from clinic.

## 2023-11-22 NOTE — Patient Instructions (Addendum)
 VISIT SUMMARY:  Today, you visited us  due to right shoulder pain following an injury while lifting heavy items. You also discussed your history of hypertension and the fact that you have not been taking your blood pressure medication since 2023.  YOUR PLAN:  -RIGHT SHOULDER PAIN: You have acute pain in your right shoulder likely due to a soft tissue injury or a possible fracture. We will give you a pain relief injection and prescribe ibuprofen to help with swelling. An x-ray of your right shoulder will be done to check for any fractures. We will coordinate with the congregational nurse to help you with the imaging and prescription logistics.  -HYPERTENSION: Hypertension is high blood pressure. You have not been taking your blood pressure medication since 2023. Your blood pressure is elevated today, I encourage you to follow-up to monitor your blood pressure and follow up with your PCP.   Shoulder Pain Many things can cause shoulder pain, including: An injury to the shoulder. Overuse of the shoulder. Arthritis. The source of the pain can be: Inflammation. An injury to the shoulder joint. An injury to a tendon, ligament, or bone. Follow these instructions at home: Pay attention to changes in your symptoms. Let your health care provider know about them. Follow these instructions to relieve your pain. If you have a removable sling: Wear the sling as told by your provider. Remove it only as told by your provider. Check the skin around the sling every day. Tell your provider about any concerns. Loosen the sling if your fingers tingle, become numb, or become cold. Keep the sling clean. If the sling is not waterproof: Do not let it get wet. Remove it to shower or bathe. Move your arm as little as possible, but keep your hand moving to prevent swelling. Managing pain, stiffness, and swelling  If told, put ice on the painful area. If you have a removable sling or immobilizer, remove it as  told by your provider. Put ice in a plastic bag. Place a towel between your skin and the bag. Leave the ice on for 20 minutes, 2-3 times a day. If your skin turns bright red, remove the ice right away to prevent skin damage. The risk of damage is higher if you cannot feel pain, heat, or cold. Move your fingers often to reduce stiffness and swelling. Squeeze a soft ball or a foam pad as much as possible. This helps to keep the shoulder from swelling. It also helps to strengthen the arm. General instructions Take over-the-counter and prescription medicines only as told by your provider. Exercise may help with pain management. Perform exercises if told by your provider. You may be referred to a physical therapist to help in your recovery process. Keep all follow-up visits in order to avoid any type of permanent shoulder disability or chronic pain problems. Contact a health care provider if: Your pain is not relieved with medicines. New pain develops in your arm, hand, or fingers. You loosen your sling and your arm, hand, or fingers remain tingly, numb, swollen, or painful. Get help right away if: Your arm, hand, or fingers turn white or blue. This information is not intended to replace advice given to you by your health care provider. Make sure you discuss any questions you have with your health care provider. Document Revised: 02/05/2022 Document Reviewed: 02/05/2022 Elsevier Patient Education  2024 [employer].
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
## 2023-11-23 ENCOUNTER — Other Ambulatory Visit: Payer: Self-pay

## 2023-12-15 ENCOUNTER — Ambulatory Visit (HOSPITAL_COMMUNITY)
Admission: EM | Admit: 2023-12-15 | Discharge: 2023-12-16 | Disposition: A | Discharge status: Other Acute Inpatient Hospital | Attending: Behavioral Health | Admitting: Behavioral Health

## 2023-12-15 DIAGNOSIS — Z5900 Homelessness unspecified: Secondary | ICD-10-CM | POA: Insufficient documentation

## 2023-12-15 DIAGNOSIS — F259 Schizoaffective disorder, unspecified: Secondary | ICD-10-CM | POA: Insufficient documentation

## 2023-12-15 LAB — POCT URINE DRUG SCREEN - MANUAL ENTRY (I-SCREEN)
POC Amphetamine UR: NOT DETECTED
POC Buprenorphine (BUP): NOT DETECTED
POC Cocaine UR: NOT DETECTED
POC Marijuana UR: NOT DETECTED
POC Methadone UR: NOT DETECTED
POC Methamphetamine UR: NOT DETECTED
POC Morphine: NOT DETECTED
POC Oxazepam (BZO): NOT DETECTED
POC Oxycodone UR: NOT DETECTED
POC Secobarbital (BAR): NOT DETECTED

## 2023-12-15 LAB — CBC WITH DIFFERENTIAL/PLATELET
Abs Immature Granulocytes: 0.01 10*3/uL (ref 0.00–0.07)
Basophils Absolute: 0.1 10*3/uL (ref 0.0–0.1)
Basophils Relative: 2 %
Eosinophils Absolute: 0.1 10*3/uL (ref 0.0–0.5)
Eosinophils Relative: 1 %
HCT: 40.3 % (ref 39.0–52.0)
Hemoglobin: 13.5 g/dL (ref 13.0–17.0)
Immature Granulocytes: 0 %
Lymphocytes Relative: 41 %
Lymphs Abs: 1.6 10*3/uL (ref 0.7–4.0)
MCH: 28.1 pg (ref 26.0–34.0)
MCHC: 33.5 g/dL (ref 30.0–36.0)
MCV: 84 fL (ref 80.0–100.0)
Monocytes Absolute: 0.3 10*3/uL (ref 0.1–1.0)
Monocytes Relative: 8 %
Neutro Abs: 1.9 10*3/uL (ref 1.7–7.7)
Neutrophils Relative %: 48 %
Platelets: 255 10*3/uL (ref 150–400)
RBC: 4.8 MIL/uL (ref 4.22–5.81)
RDW: 12.6 % (ref 11.5–15.5)
WBC: 4 10*3/uL (ref 4.0–10.5)
nRBC: 0 % (ref 0.0–0.2)

## 2023-12-15 LAB — COMPREHENSIVE METABOLIC PANEL WITH GFR
ALT: 27 U/L (ref 0–44)
AST: 42 U/L — ABNORMAL HIGH (ref 15–41)
Albumin: 4.2 g/dL (ref 3.5–5.0)
Alkaline Phosphatase: 67 U/L (ref 38–126)
Anion gap: 9 (ref 5–15)
BUN: 14 mg/dL (ref 6–20)
CO2: 26 mmol/L (ref 22–32)
Calcium: 10.5 mg/dL — ABNORMAL HIGH (ref 8.9–10.3)
Chloride: 100 mmol/L (ref 98–111)
Creatinine, Ser: 0.96 mg/dL (ref 0.61–1.24)
GFR, Estimated: >60 mL/min (ref >60)
Glucose, Bld: 164 mg/dL — ABNORMAL HIGH (ref 70–99)
Potassium: 3.7 mmol/L (ref 3.5–5.1)
Sodium: 135 mmol/L (ref 135–145)
Total Bilirubin: 1 mg/dL (ref 0.0–1.2)
Total Protein: 6.8 g/dL (ref 6.5–8.1)

## 2023-12-15 LAB — TSH: TSH: 0.924 u[IU]/mL (ref 0.350–4.500)

## 2023-12-15 LAB — LIPID PANEL
Cholesterol: 165 mg/dL (ref 0–200)
HDL: 54 mg/dL (ref >40)
LDL Cholesterol: 105 mg/dL — ABNORMAL HIGH (ref 0–99)
Total CHOL/HDL Ratio: 3.1 ratio
Triglycerides: 29 mg/dL (ref <150)
VLDL: 6 mg/dL (ref 0–40)

## 2023-12-15 LAB — HEMOGLOBIN A1C
Hgb A1c MFr Bld: 5.4 % (ref 4.8–5.6)
Mean Plasma Glucose: 108.28 mg/dL

## 2023-12-15 LAB — ETHANOL: Alcohol, Ethyl (B): <15 mg/dL (ref <15)

## 2023-12-15 LAB — VALPROIC ACID LEVEL: Valproic Acid Lvl: <10 ug/mL — ABNORMAL LOW (ref 50–100)

## 2023-12-15 MED ORDER — TRAZODONE HCL 50 MG PO TABS
50.0000 mg | ORAL_TABLET | Freq: Every evening | ORAL | Status: DC | PRN
  Filled 2023-12-15: qty 1

## 2023-12-15 MED ORDER — OLANZAPINE 5 MG PO TBDP
5.0000 mg | ORAL_TABLET | Freq: Two times a day (BID) | ORAL | Status: DC
  Filled 2023-12-15 (×2): qty 1

## 2023-12-15 MED ORDER — HYDROXYZINE HCL 25 MG PO TABS: 25.0000 mg | ORAL_TABLET | Freq: Three times a day (TID) | ORAL | Status: DC | PRN

## 2023-12-15 MED ORDER — NICOTINE 21 MG/24HR TD PT24
21.0000 mg | MEDICATED_PATCH | Freq: Every day | TRANSDERMAL | Status: DC
  Filled 2023-12-15: qty 1

## 2023-12-15 MED ORDER — HALOPERIDOL LACTATE 5 MG/ML IJ SOLN: 10.0000 mg | Freq: Three times a day (TID) | INTRAMUSCULAR | Status: DC | PRN

## 2023-12-15 MED ORDER — HALOPERIDOL LACTATE 5 MG/ML IJ SOLN: 5.0000 mg | Freq: Three times a day (TID) | INTRAMUSCULAR | Status: DC | PRN

## 2023-12-15 MED ORDER — DIPHENHYDRAMINE HCL 50 MG/ML IJ SOLN: 50.0000 mg | Freq: Three times a day (TID) | INTRAMUSCULAR | Status: DC | PRN

## 2023-12-15 MED ORDER — HALOPERIDOL 5 MG PO TABS: 5.0000 mg | ORAL_TABLET | Freq: Three times a day (TID) | ORAL | Status: DC | PRN

## 2023-12-15 MED ORDER — CLONIDINE HCL 0.1 MG PO TABS
0.1000 mg | ORAL_TABLET | Freq: Three times a day (TID) | ORAL | Status: DC | PRN
  Filled 2023-12-15: qty 1

## 2023-12-15 MED ORDER — LORAZEPAM 2 MG/ML IJ SOLN: 2.0000 mg | Freq: Three times a day (TID) | INTRAMUSCULAR | Status: DC | PRN

## 2023-12-15 MED ORDER — DIPHENHYDRAMINE HCL 50 MG PO CAPS: 50.0000 mg | ORAL_CAPSULE | Freq: Three times a day (TID) | ORAL | Status: DC | PRN

## 2023-12-15 MED ORDER — ACETAMINOPHEN 325 MG PO TABS: 650.0000 mg | ORAL_TABLET | Freq: Four times a day (QID) | ORAL | Status: DC | PRN

## 2023-12-15 MED ORDER — ALUM & MAG HYDROXIDE-SIMETH 200-200-20 MG/5ML PO SUSP: 30.0000 mL | ORAL | Status: DC | PRN

## 2023-12-15 MED ORDER — MAGNESIUM HYDROXIDE 400 MG/5ML PO SUSP: 30.0000 mL | Freq: Every day | ORAL | Status: DC | PRN

## 2023-12-15 NOTE — ED Notes (Signed)
 NP Cydney Draft. Notified of BP 154/80 and patient continually refusing from multiple nurses PRN Clonidine and night time dose of Zyprexa . NNO advised at this time.
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
## 2023-12-15 NOTE — ED Notes (Signed)
 Pt displaying mood lability towards end of shift.   Pt loud and boisterous.   Pt angry that he is here and he does not want to be or thinks he needs to be.  Pt angry that he is not allowed to smoke and refused nicotine replacement.   Frustrated that he is not allowed to move freely in obs (d/t being in flex)  Pt is slated to go to [facility] in AM. Pt is loud, he his not threatening staff or damaging property at present

## 2023-12-15 NOTE — ED Notes (Signed)
 Pt woke up and started yelling at staff about needing to go to the bathroom. Pt verbally aggressive. Security called to the unit. Staff attempting to de-escalate pt.

## 2023-12-15 NOTE — Progress Notes (Signed)
   12/15/23 0926  [facility] Triage Screening (Walk-ins at [facility] only)  What Is the Reason for Your Visit/Call Today? Charles Cortez presents to [facility] under IVC escorted by [employer]. Pt is not willing to cooperative during triage process and states that he does not want to be here. Pt is agitated about not know where his money is for the last 4 years.  How Long Has This Been Causing You Problems? > than 6 months  Have You Recently Had Any Thoughts About Hurting Yourself?  (UTA)  Are You Planning to Commit Suicide/Harm Yourself At This time?  (UTA)  Have you Recently Had Thoughts About Hurting Someone Else?  (UTA)  Are You Planning To Harm Someone At This Time?  (UTA)  Physical Abuse  (UTA)  Verbal Abuse  (UTA)  Sexual Abuse  (UTA)  Exploitation of patient/patient's resources  (UTA)  Self-Neglect  (UTA)  Are you currently experiencing any auditory, visual or other hallucinations?  (UTA)  Have You Used Any Alcohol or Drugs in the Past 24 Hours?  (UTA)  Do you have any current medical co-morbidities that require immediate attention?  (UTA)  Clinician description of patient physical appearance/behavior: agitated, anxious  What Do You Feel Would Help You the Most Today?  (UTA)  If access to [facility] was not available, would you have sought care in the Emergency Department?  (UTA)  Determination of Need Urgent (48 hours)  Options For Referral Medication Management;Inpatient Hospitalization;Outpatient Therapy  Determination of Need filed? Yes

## 2023-12-15 NOTE — ED Notes (Signed)
 Patient resting quietly in bed with eyes closed. Unlabored breathing. NAD noted. Facility protocol safety checks in place. Pt is currently safe on the unit.

## 2023-12-15 NOTE — ED Notes (Signed)
 Pt roomed in obs flex bed 2. Circumstantial speech.  Pressured and rambling at times.   Mentions social security often and a big part of his problems. Pt cooperative with skin assessment and admission process. Pt is aware of need for urine sample and agrees to provide sample.  Denied current SI plan and intent.  Denied HI and A/V hallucinations

## 2023-12-15 NOTE — Progress Notes (Signed)
 Inpatient Psychiatric Referral  Patient was recommended inpatient per  Nickola Baron, NP). There are no available beds at [facility], per [facility] Bevin Bucks, RN. Patient was referred to the following out of network facilities:  Destination  Service Provider Address Phone Fax  [facility] [address] [place] [address] [phone]  [facility] [address] [place], [place] [address] [phone]  [facility] [address] [place] [address] [phone]  [facility] [address] [place] [address] [phone]  [facility] [address] [place] [address] [phone]  [facility] [address] [place] [address] [phone]  [facility] EFAX [address] [place], [place] [phone]  [facility] [address] [place], [place] [address]      Situation ongoing, CSW to continue following and update chart as more information becomes available  Albertus Alt MSW, LCSWA 12/15/2023  1:42PM
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
## 2023-12-15 NOTE — ED Notes (Signed)
 Patient observed sitting in chair next to bed with shirt off. Patient refuses to remain with shirt on and is loud, cussing, and not able to be redirected verbally. Patient is aggressive in his stance and walk and speaking loud. Unable to fully assess due to patient's behaviors as patient not receptive to questions from staff. Continuing to monitor.

## 2023-12-15 NOTE — ED Notes (Signed)
 Pt refused scheduled zyprexa  po.  Provider aware

## 2023-12-15 NOTE — ED Notes (Signed)
Pt verbally deescalated

## 2023-12-15 NOTE — ED Notes (Signed)
 [facility] and pt is first on the list to be transported tomorrow d/t they can't transport pt today. Team notified.

## 2023-12-15 NOTE — Progress Notes (Signed)
 Pt has been accepted to [facility] on 12/15/2023  Bed assignment: Main campus  Pt meets inpatient criteria per Nickola Baron, NP  Attending Physician will be Lavona Pounds, MD  Report can be called to: [phone]   Pt can arrive after ASAP  Care Team Notified: Nickola Baron, NP, Nyle Belling, RN

## 2023-12-15 NOTE — ED Notes (Signed)
 Pt observed resting in chair at present, eyes closed  respirations even and non labored NAD

## 2023-12-15 NOTE — BH Assessment (Signed)
 Comprehensive Clinical Assessment (CCA) Note  12/15/2023 Charles Cortez [id number]  DISPOSITION: Per Nickola Baron NP pt is recommended for inpatient psychiatric admission  The patient demonstrates the following risk factors for suicide: Chronic risk factors for suicide include: psychiatric disorder of schiuzophrenia. Acute risk factors for suicide include: unemployment. Protective factors for this patient include: positive social support. Considering these factors, the overall suicide risk at this point appears to be low. Patient is appropriate for outpatient follow up.   Per Triage assessment: "Demontrae Gilbert presents to [facility] under IVC escorted by [employer]. Pt is not willing to cooperative during triage process and states that he does not want to be here. Pt is agitated about not know where his money is for the last 4 years. "  With further assessment: Pt was argumentative, contrary, verbally aggressive and evasive and as a result only a few question could be answered. Hx of violence per chart and aggressiveness when assessed.    Chief Complaint:  Chief Complaint  Patient presents with   IVC   Visit Diagnosis:  Schizophrenia    CCA Screening, Triage and Referral (STR)  Patient Reported Information How did you hear about us ? Law enforcement What Is the Reason for Your Visit/Call Today? Jamael Hoffmann presents to [facility] under IVC escorted by [employer]. Pt is not willing to cooperative during triage process and states that he does not want to be here. Pt is agitated about not know where his money is for the last 4 years.  How Long Has This Been Causing You Problems? > than 6 months  What Do You Feel Would Help You the Most Today? -- (UTA)   Have You Recently Had Any Thoughts About Hurting Yourself? -- (UTA)  Are You Planning to Commit Suicide/Harm Yourself At This time? -- Ambrose Bailer)   Flowsheet Row ED from 12/15/2023 in [facility] ED from 01/15/2022 in [facility] Emergency Department at [facility]  C-SSRS RISK CATEGORY No Risk No Risk       Have you Recently Had Thoughts About Hurting Someone Else? -- (UTA)  Are You Planning to Harm Someone at This Time? -- (UTA)  Explanation: na  Have You Used Any Alcohol or Drugs in the Past 24 Hours? -- (UTA)  How Long Ago Did You Use Drugs or Alcohol? na What Did You Use and How Much? na  Do You Currently Have a Therapist/Psychiatrist? No (none reported)  Name of Therapist/Psychiatrist:    Have You Been Recently Discharged From Any Office Practice or Programs? -- (unknown)  Explanation of Discharge From Practice/Program: na    CCA Screening Triage Referral Assessment Type of Contact: Face-to-Face  Telemedicine Service Delivery:   Is this Initial or Reassessment?   Date Telepsych consult ordered in [facility]:    Time Telepsych consult ordered in [facility]:    Location of Assessment: [facility] Assessment Services  Provider Location: [facility] Assessment Services   Collateral Involvement: none   Does Patient Have a Automotive engineer Guardian? No  Legal Guardian Contact Information: na  Copy of Legal Guardianship Form: -- (na)  Legal Guardian Notified of Arrival: -- (na)  Legal Guardian Notified of Pending Discharge: -- (na)  If Minor and Not Living with Parent(s), Who has Custody? adult  Is CPS involved or ever been involved? -- (none reported)  Is APS involved or ever been involved? -- (none reported)   Patient Determined To Be At Risk for Harm To Self or Others Based  on Review of Patient Reported Information or Presenting Complaint? Yes, for Harm to Others (Hx of violence per chart and aggressiveness when assessed.)  Method: -- (Pt was argumentative, contrary, verbally aggressive and evasive and as a result only a few question could be answered.)  Availability of Means: -- (Pt was argumentative, contrary, verbally aggressive and evasive and as a result only a few question  could be answered.)  Intent: -- (Pt was argumentative, contrary, verbally aggressive and evasive and as a result only a few question could be answered.)  Notification Required: na Additional Information for Danger to Others Potential: -- (Pt was argumentative, contrary, verbally aggressive and evasive and as a result only a few question could be answered.)  Additional Comments for Danger to Others Potential: na  Are There Guns or Other Weapons in Your Home? -- (Pt was argumentative, contrary, verbally aggressive and evasive and as a result only a few question could be answered.)  Types of Guns/Weapons: na  Are These Weapons Safely Secured?                            -- (na)  Who Could Verify You Are Able To Have These Secured: unknown  Do You Have any Outstanding Charges, Pending Court Dates, Parole/Probation? Pt was argumentative, contrary, verbally aggressive and evasive and as a result only a few question could be answered.  Contacted To Inform of Risk of Harm To Self or Others: Law Enforcement (came to [facility] via LE)    Does Patient Present under Involuntary Commitment? Yes    [place] of Residence: [place]   Patient Currently Receiving the Following Services: -- (Pt was argumentative, contrary, verbally aggressive and evasive and as a result only a few question could be answered.)   Determination of Need: Emergent (2 hours) (Per Nickola Baron NP pt is recommended for inpatient psychiatric admission)   Options For Referral: Inpatient Hospitalization     CCA Biopsychosocial Patient Reported Schizophrenia/Schizoaffective Diagnosis in Past: Yes   Strengths: Pt was argumentative, contrary, verbally aggressive and evasive and as a result only a few question could be answered.   Mental Health Symptoms Depression:  -- (Pt was argumentative, contrary, verbally aggressive and evasive and as a result only a few question could be answered.)   Duration of Depressive symptoms:     Mania:  Increased Energy; Irritability; Overconfidence; Racing thoughts; Recklessness   Anxiety:   -- (Pt was argumentative, contrary, verbally aggressive and evasive and as a result only a few question could be answered.)   Psychosis:  -- (Did not appear to be responding to internal stimuli- Pt was argumentative, contrary, verbally aggressive and evasive and as a result only a few question could be answered.)   Duration of Psychotic symptoms:    Trauma:  -- (Pt was argumentative, contrary, verbally aggressive and evasive and as a result only a few question could be answered.)   Obsessions:  None   Compulsions:  None   Inattention:  Does not seem to listen; Avoids/dislikes activities that require focus; Does not follow instructions (not oppositional)   Hyperactivity/Impulsivity:  Blurts out answers; Feeling of restlessness; Fidgets with hands/feet   Oppositional/Defiant Behaviors:  Aggression towards people/animals; Angry; Argumentative; Easily annoyed   Emotional Irregularity:  -- (Pt was argumentative, contrary, verbally aggressive and evasive and as a result only a few question could be answered.)   Other Mood/Personality Symptoms:  none    Mental Status Exam Appearance and self-care  Stature:  Average   Weight:  Average weight   Clothing:  Casual   Grooming:  Normal   Cosmetic use:  None   Posture/gait:  Tense   Motor activity:  Agitated   Sensorium  Attention:  Vigilant   Concentration:  Focuses on irrelevancies; Scattered   Orientation:  X5 (appears fully oriented)   Recall/memory:  -- (Pt was argumentative, contrary, verbally aggressive and evasive and as a result only a few question could be answered.)   Affect and Mood  Affect:  Flat; Labile (Agitated)   Mood:  Irritable; Negative (Agitated)   Relating  Eye contact:  Fleeting   Facial expression:  Tense; Responsive   Attitude toward examiner:  Argumentative; Defensive; Irritable; Hostile;  Suspicious   Thought and Language  Speech flow: Clear and Coherent   Thought content:  Suspicious (Contrary)   Preoccupation:  -- (Pt was argumentative, contrary, verbally aggressive and evasive and as a result only a few question could be answered.)   Hallucinations:  None (did not appear to be responding to internal stimuli)   Organization:  Disorganized; Circumstantial   Company secretary of Knowledge:  Fair   Intelligence:  Average   Abstraction:  Functional   Judgement:  Impaired   Reality Testing:  Distorted   Insight:  Lacking; Gaps; Poor   Decision Making:  Impulsive   Social Functioning  Social Maturity:  -- (Pt was argumentative, contrary, verbally aggressive and evasive and as a result only a few question could be answered.)   Social Judgement:  -- (Pt was argumentative, contrary, verbally aggressive and evasive and as a result only a few question could be answered.)   Stress  Stressors:  -- (Pt was argumentative, contrary, verbally aggressive and evasive and as a result only a few question could be answered.)   Coping Ability:  Exhausted; Overwhelmed   Skill Deficits:  Interpersonal; Self-care   Supports:  -- (Pt was argumentative, contrary, verbally aggressive and evasive and as a result only a few question could be answered.)     Religion: Religion/Spirituality Are You A Religious Person?:  (Pt was argumentative, contrary, verbally aggressive and evasive and as a result only a few question could be answered.) How Might This Affect Treatment?: unknown  Leisure/Recreation: Leisure / Recreation Do You Have Hobbies?:  (Pt was argumentative, contrary, verbally aggressive and evasive and as a result only a few question could be answered.)  Exercise/Diet: Exercise/Diet Do You Exercise?:  (Pt was argumentative, contrary, verbally aggressive and evasive and as a result only a few question could be answered.) Have You Gained or Lost A Significant  Amount of Weight in the Past Six Months?:  (Pt was argumentative, contrary, verbally aggressive and evasive and as a result only a few question could be answered.) Do You Follow a Special Diet?:  (Pt was argumentative, contrary, verbally aggressive and evasive and as a result only a few question could be answered.) Do You Have Any Trouble Sleeping?:  (Pt was argumentative, contrary, verbally aggressive and evasive and as a result only a few question could be answered.)   CCA Employment/Education Employment/Work Situation: Employment / Work Situation Employment Situation: Unemployed (per chart) Patient's Job has Been Impacted by Current Illness: Yes Describe how Patient's Job has Been Impacted: Per chart, was doing work through [employer], temporary work, has always had jobs on and off Has Patient ever Been in Equities trader?: No  Education: Education Is Patient Currently Attending School?: No Last Grade Completed:  (  Pt was argumentative, contrary, verbally aggressive and evasive and as a result only a few question could be answered.) Did You Attend College?:  (Pt was argumentative, contrary, verbally aggressive and evasive and as a result only a few question could be answered.) Did You Have An Individualized Education Program (IIEP):  (Pt was argumentative, contrary, verbally aggressive and evasive and as a result only a few question could be answered.) Did You Have Any Difficulty At School?:  (Pt was argumentative, contrary, verbally aggressive and evasive and as a result only a few question could be answered.) Patient's Education Has Been Impacted by Current Illness:  (Pt was argumentative, contrary, verbally aggressive and evasive and as a result only a few question could be answered.)   CCA Family/Childhood History Family and Relationship History: Family history Marital status: Single (per chart) Does patient have children?: Yes (per chart) How many children?: 1 (per chart) How is  patient's relationship with their children?: unknown  Childhood History:  Childhood History By whom was/is the patient raised?: Mother, Father (per chart) Did patient suffer any verbal/emotional/physical/sexual abuse as a child?: No Has patient ever been sexually abused/assaulted/raped as an adolescent or adult?: No Witnessed domestic violence?: No Has patient been affected by domestic violence as an adult?: No       CCA Substance Use Alcohol/Drug Use: Alcohol / Drug Use Pain Medications: Denies abuse Prescriptions: Denies abuse Over the Counter: Denies abuse History of alcohol / drug use?: Yes (Pt reports he has a history of abusing drugs but denies use in 12+ years per chart) Longest period of sobriety (when/how long): 12 years plus                         ASAM's:  Six Dimensions of Multidimensional Assessment  Dimension 1:  Acute Intoxication and/or Withdrawal Potential:      Dimension 2:  Biomedical Conditions and Complications:      Dimension 3:  Emotional, Behavioral, or Cognitive Conditions and Complications:     Dimension 4:  Readiness to Change:     Dimension 5:  Relapse, Continued use, or Continued Problem Potential:     Dimension 6:  Recovery/Living Environment:     ASAM Severity Score:    ASAM Recommended Level of Treatment:     Substance use Disorder (SUD)    Recommendations for Services/Supports/Treatments:    Disposition Recommendation per psychiatric provider: We recommend inpatient psychiatric hospitalization after medical hospitalization. Patient has been involuntarily committed on 12/15/23.    DSM5 Diagnoses: Patient Active Problem List   Diagnosis Date Noted   Acute psychosis (HCC)    Paranoid schizophrenia (HCC)    Schizophrenia (HCC) 09/18/2014   Elevated CPK 09/17/2014   HTN (hypertension) 09/17/2014     Referrals to Alternative Service(s): Referred to Alternative Service(s):   Place:   Date:   Time:    Referred to  Alternative Service(s):   Place:   Date:   Time:    Referred to Alternative Service(s):   Place:   Date:   Time:    Referred to Alternative Service(s):   Place:   Date:   Time:     Casaundra Takacs T, Counselor

## 2023-12-15 NOTE — ED Notes (Signed)
 Pt's behaviors have improved over the course of the shift.  More easily redirected and has been less irritable

## 2023-12-16 DIAGNOSIS — F259 Schizoaffective disorder, unspecified: Secondary | ICD-10-CM | POA: Diagnosis not present
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
## 2023-12-16 NOTE — ED Notes (Signed)
 Patient alert and setting up in chair next to bed. Has not rested tonight and continues with erratic unpredictable behaviors including yelling at security and other staff. Continuing to monitor.

## 2023-12-16 NOTE — ED Notes (Signed)
 Building services engineer contacted for transport to [facility].

## 2023-12-16 NOTE — ED Notes (Addendum)
 Pt awake and yelling loudly at staff at change of shift. Plan is to transfer to [facility] today. Pt still refused medications. Meal offered and pt stopped yelling. Will continue to monitor and report changes.

## 2023-12-16 NOTE — Discharge Instructions (Addendum)
 Accepted to [facility] on 12/15/2023 Bed assignment: Main campus  Attending Physician,  Lavona Pounds, MD

## 2023-12-29 ENCOUNTER — Other Ambulatory Visit: Payer: Self-pay

## 2023-12-29 MED ORDER — LITHIUM CARBONATE ER 300 MG PO TBCR
600.0000 mg | EXTENDED_RELEASE_TABLET | Freq: Two times a day (BID) | ORAL | 1 refills | Status: AC
  Filled 2023-12-29: qty 60, 15d supply, fill #0

## 2023-12-29 MED ORDER — DIVALPROEX SODIUM 250 MG PO DR TAB
750.0000 mg | DELAYED_RELEASE_TABLET | Freq: Three times a day (TID) | ORAL | 1 refills | Status: AC
  Filled 2023-12-29: qty 135, 15d supply, fill #0

## 2023-12-29 MED ORDER — HALOPERIDOL 10 MG PO TABS
10.0000 mg | ORAL_TABLET | Freq: Two times a day (BID) | ORAL | 1 refills | Status: AC
  Filled 2023-12-29: qty 30, 15d supply, fill #0

## 2023-12-29 MED ORDER — HALOPERIDOL DECANOATE 100 MG/ML IM SOLN: 100.0000 mg | INTRAMUSCULAR | 1 refills | Status: AC

## 2023-12-29 MED ORDER — NICOTINE POLACRILEX 2 MG MT GUM
2.0000 mg | CHEWING_GUM | OROMUCOSAL | 1 refills | Status: AC | PRN
  Filled 2023-12-29: qty 150, 13d supply, fill #0

## 2024-01-04 ENCOUNTER — Other Ambulatory Visit: Payer: Self-pay

## 2024-01-04 MED ORDER — DIVALPROEX SODIUM 250 MG PO DR TAB
750.0000 mg | DELAYED_RELEASE_TABLET | Freq: Two times a day (BID) | ORAL | 1 refills | Status: AC
  Filled 2024-01-04: qty 90, 15d supply, fill #0

## 2024-01-09 ENCOUNTER — Other Ambulatory Visit: Payer: Self-pay

## 2024-05-24 ENCOUNTER — Ambulatory Visit

## 2024-05-24 NOTE — Congregational Nurse Program (Signed)
  Dept: [phone]   Congregational Nurse Program Note  Date of Encounter: 05/24/2024  Past Medical History: Past Medical History:  Diagnosis Date   Bipolar affective disorder (HCC)    Depression    GERD (gastroesophageal reflux disease)    Hypertension    Paranoid schizophrenia [facility])     Encounter Details:  Community Questionnaire - 05/24/24 1045       Questionnaire   Ask client: Do you give verbal consent for me to treat you today? Yes    Student Assistance N/A    Location Patient Served  [facility]    Encounter Setting CN site    Population Status Unhoused    Insurance Medicare    Insurance/Financial Assistance Referral N/A    Medication N/A    Medical Provider No    Screening Referrals Made N/A    Medical Referrals Made [facility]    Medical Appointment Completed [facility]    CNP Interventions Advocate/Support;Navigate Healthcare System;Case Management;Educate;Counsel    Screenings CN Performed Blood Pressure    ED Visit Averted Yes    Life-Saving Intervention Made N/A         Client to RN office for blood pressure check. Client states that he is having a headache, and had dizziness when standing up. He states that he has only been out of a vending machine because he had no other food options, but has been drinking water . BP (!) 156/85 (BP Location: Right Arm, Patient Position: Sitting, Cuff Size: Normal)   Pulse 85   RN referred client to Ronal Caldron Placey at [facility] to follow up. No further concerns and RN will continue to follow.
